# Patient Record
Sex: Female | Born: 1980 | Race: Black or African American | Hispanic: No | Marital: Single | Smoking: Current every day smoker
Health system: Southern US, Community
[De-identification: ages and names within clinical notes are randomized; demographics above are authoritative.]

## PROBLEM LIST (undated history)

## (undated) DIAGNOSIS — I219 Acute myocardial infarction, unspecified: Secondary | ICD-10-CM

## (undated) DIAGNOSIS — E785 Hyperlipidemia, unspecified: Secondary | ICD-10-CM

## (undated) DIAGNOSIS — F329 Major depressive disorder, single episode, unspecified: Secondary | ICD-10-CM

## (undated) DIAGNOSIS — K219 Gastro-esophageal reflux disease without esophagitis: Secondary | ICD-10-CM

## (undated) DIAGNOSIS — F32A Depression, unspecified: Secondary | ICD-10-CM

## (undated) HISTORY — DX: Gastro-esophageal reflux disease without esophagitis: K21.9

## (undated) HISTORY — PX: CHOLECYSTECTOMY: SHX55

## (undated) HISTORY — PX: TONSILLECTOMY AND ADENOIDECTOMY: SUR1326

## (undated) HISTORY — DX: Major depressive disorder, single episode, unspecified: F32.9

## (undated) HISTORY — DX: Acute myocardial infarction, unspecified: I21.9

## (undated) HISTORY — DX: Hyperlipidemia, unspecified: E78.5

## (undated) HISTORY — DX: Depression, unspecified: F32.A

---

## 2011-12-28 ENCOUNTER — Encounter (HOSPITAL_BASED_OUTPATIENT_CLINIC_OR_DEPARTMENT_OTHER): Payer: Self-pay | Admitting: Emergency Medicine

## 2011-12-28 ENCOUNTER — Emergency Department (HOSPITAL_BASED_OUTPATIENT_CLINIC_OR_DEPARTMENT_OTHER)
Admission: EM | Admit: 2011-12-28 | Discharge: 2011-12-28 | Disposition: A | Discharge status: Home / Self Care | Payer: Self-pay | Attending: Emergency Medicine | Admitting: Emergency Medicine

## 2011-12-28 DIAGNOSIS — F17200 Nicotine dependence, unspecified, uncomplicated: Secondary | ICD-10-CM | POA: Insufficient documentation

## 2011-12-28 DIAGNOSIS — L408 Other psoriasis: Secondary | ICD-10-CM | POA: Insufficient documentation

## 2011-12-28 DIAGNOSIS — L42 Pityriasis rosea: Secondary | ICD-10-CM

## 2011-12-28 LAB — RPR: RPR Ser Ql: NONREACTIVE

## 2011-12-28 LAB — PREGNANCY, URINE: Preg Test, Ur: NEGATIVE

## 2011-12-28 MED ORDER — HYDROXYZINE HCL 25 MG PO TABS: 25.0000 mg | ORAL_TABLET | Freq: Four times a day (QID) | ORAL | Status: AC | PRN

## 2011-12-28 MED ORDER — HYDROXYZINE HCL 25 MG PO TABS
25.0000 mg | ORAL_TABLET | Freq: Once | ORAL | Status: AC
  Administered 2011-12-28: 25 mg via ORAL
  Filled 2011-12-28: qty 1

## 2011-12-28 NOTE — ED Notes (Signed)
Pt reports bumps on fingers and hands x 2 weeks, now under breast

## 2011-12-28 NOTE — ED Provider Notes (Signed)
History     CSN: [id number]  Arrival date & time 12/28/11  0121   First MD Initiated Contact with Patient 12/28/11 [phone]      Chief Complaint  Patient presents with  . Rash    (Consider location/radiation/quality/duration/timing/severity/associated sxs/prior treatment) HPI This is a 31 year old black female with a several week history of rash. She is somewhat equivocal about when the first symptoms appeared. She states she initially had a hyperpigmented plaque develop on her left flank in February and another plaque on her right jaw about 3 weeks ago. She had been treating these with calamine lotion. Subsequent to the appearance of the plaque on her right jaw she has developed a more generalized occurrence of a smaller, less severe plaques primarily on her chest. She is also complaining of small "heat bumps" just proximal to her fingernails. She states all of these lesions are severely pruritic. She denies systemic symptoms such as fever, chills or cold symptoms.  History reviewed. No pertinent past medical history.  History reviewed. No pertinent past surgical history.  History reviewed. No pertinent family history.  History  Substance Use Topics  . Smoking status: Current Everyday Smoker -- 1.0 packs/day  . Smokeless tobacco: Not on file  . Alcohol Use: Yes     ocassionally    OB History    Grav Para Term Preterm Abortions TAB SAB Ect Mult Living                  Review of Systems  All other systems reviewed and are negative.    Allergies  Review of patient's allergies indicates no known allergies.  Home Medications  No current outpatient prescriptions on file.  BP 126/80  Pulse 88  Resp 20  SpO2 100%  LMP 08/03/2011  Physical Exam General: Well-developed, well-nourished female in no acute distress; appearance consistent with age of record HENT: normocephalic, atraumatic Eyes: pupils equal round and reactive to light; extraocular muscles intact Neck:  supple Heart: regular rate and rhythm Lungs: Normal respiratory effort and excursion Abdomen: soft; nondistended Extremities: No deformity; full range of motion Neurologic: Awake, alert and oriented; motor function intact in all extremities and symmetric; no facial droop Skin: Warm and dry; fading hyperpigmented irregular plaque of the left flank, hyperpigmented irregular plaque of the right mandible and scattered smaller irregular plaques primarily on the trunk; fine miliary rash proximal to fingernails Psychiatric: Normal mood and affect    ED Course  Procedures (including critical care time)     MDM  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        Hanley Seamen, MD 12/28/11 [phone]

## 2011-12-28 NOTE — Discharge Instructions (Signed)
Pityriasis Rosea Pityriasis rosea is a rash which is probably caused by a virus. It generally starts as a scaly, red patch on the trunk (the area of the body that a t-shirt would cover) but does not appear on sun exposed areas. The rash is usually preceded by an initial larger spot called the "herald patch" a week or more before the rest of the rash appears. Generally within one to two days the rash appears rapidly on the trunk, upper arms, and sometimes the upper legs. The rash usually appears as flat, oval patches of scaly pink color. The rash can also be raised and one is able to feel it with a finger. The rash can also be finely crinkled and may slough off leaving a ring of scale around the spot. Sometimes a mild sore throat is present with the rash. It usually affects children and young adults in the spring and autumn. Women are more frequently affected than men. TREATMENT  Pityriasis rosea is a self-limited condition. This means it goes away within 4 to 8 weeks without treatment. The spots may persist for several months, especially in darker-colored skin after the rash has resolved and healed. Benadryl and steroid creams may be used if itching is a problem. SEEK MEDICAL CARE IF:   Your rash does not go away or persists longer than three months.   You develop fever and joint pain.   You develop severe headache and confusion.   You develop breathing difficulty, vomiting and/or extreme weakness.  Document Released: 07/25/2001 Document Revised: 06/07/2011 Document Reviewed: 08/13/2008 [employer] Patient Information 2012 [employer]. 

## 2013-03-25 ENCOUNTER — Emergency Department (HOSPITAL_BASED_OUTPATIENT_CLINIC_OR_DEPARTMENT_OTHER)
Admission: EM | Admit: 2013-03-25 | Discharge: 2013-03-25 | Disposition: A | Discharge status: Home / Self Care | Payer: Self-pay | Attending: Emergency Medicine | Admitting: Emergency Medicine

## 2013-03-25 ENCOUNTER — Encounter (HOSPITAL_BASED_OUTPATIENT_CLINIC_OR_DEPARTMENT_OTHER): Payer: Self-pay | Admitting: *Deleted

## 2013-03-25 DIAGNOSIS — R51 Headache: Secondary | ICD-10-CM | POA: Insufficient documentation

## 2013-03-25 DIAGNOSIS — K0889 Other specified disorders of teeth and supporting structures: Secondary | ICD-10-CM

## 2013-03-25 DIAGNOSIS — K089 Disorder of teeth and supporting structures, unspecified: Secondary | ICD-10-CM | POA: Insufficient documentation

## 2013-03-25 DIAGNOSIS — K047 Periapical abscess without sinus: Secondary | ICD-10-CM | POA: Insufficient documentation

## 2013-03-25 DIAGNOSIS — M542 Cervicalgia: Secondary | ICD-10-CM | POA: Insufficient documentation

## 2013-03-25 DIAGNOSIS — F17200 Nicotine dependence, unspecified, uncomplicated: Secondary | ICD-10-CM | POA: Insufficient documentation

## 2013-03-25 DIAGNOSIS — K029 Dental caries, unspecified: Secondary | ICD-10-CM | POA: Insufficient documentation

## 2013-03-25 MED ORDER — BUPIVACAINE-EPINEPHRINE (PF) 0.5% -1:200000 IJ SOLN
INTRAMUSCULAR | Status: AC
  Administered 2013-03-25: 9 mg
  Filled 2013-03-25: qty 1.8

## 2013-03-25 MED ORDER — IBUPROFEN 800 MG PO TABS
800.0000 mg | ORAL_TABLET | Freq: Once | ORAL | Status: AC
  Administered 2013-03-25: 800 mg via ORAL
  Filled 2013-03-25: qty 1

## 2013-03-25 MED ORDER — BUPIVACAINE-EPINEPHRINE (PF) 0.5% -1:200000 IJ SOLN
INTRAMUSCULAR | Status: AC
  Administered 2013-03-25: 08:00:00
  Filled 2013-03-25: qty 1.8

## 2013-03-25 MED ORDER — IBUPROFEN 800 MG PO TABS: 800.0000 mg | ORAL_TABLET | Freq: Three times a day (TID) | ORAL | Status: DC

## 2013-03-25 MED ORDER — PENICILLIN V POTASSIUM 500 MG PO TABS: 500.0000 mg | ORAL_TABLET | Freq: Four times a day (QID) | ORAL | Status: AC

## 2013-03-25 MED ORDER — PENICILLIN V POTASSIUM 250 MG PO TABS
500.0000 mg | ORAL_TABLET | Freq: Once | ORAL | Status: AC
  Administered 2013-03-25: 500 mg via ORAL
  Filled 2013-03-25: qty 2

## 2013-03-25 NOTE — ED Notes (Signed)
Care assumed and pt assessment performed prior to report.

## 2013-03-25 NOTE — ED Provider Notes (Signed)
CSN: [id number]     Arrival date & time 03/25/13  0700 History   First MD Initiated Contact with Patient 03/25/13 [phone]     Chief Complaint  Patient presents with  . Dental Pain   (Consider location/radiation/quality/duration/timing/severity/associated sxs/prior Treatment) Patient is a 32 y.o. female presenting with tooth pain. The history is provided by the patient.  Dental Pain Location:  Lower and upper Upper teeth location:  1/RU 3rd molar and 2/RU 2nd molar Lower teeth location:  31/RL 2nd molar and 32/RL 3rd molar Quality:  Dull and aching Severity:  Moderate Onset quality:  Gradual Duration:  3 days Timing:  Constant Progression:  Worsening Chronicity:  New Context: crown fracture, dental caries and dental fracture   Context: not abscess, filling still in place, not intrusion, not recent dental surgery and not trauma   Previous work-up:  Filled cavity Relieved by:  Nothing Worsened by:  Nothing tried Ineffective treatments:  Acetaminophen Associated symptoms: drooling, facial pain (R face) and neck pain   Associated symptoms: no congestion, no difficulty swallowing, no facial swelling, no fever, no gum swelling, no headaches, no neck swelling, no oral bleeding, no oral lesions and no trismus     History reviewed. No pertinent past medical history. History reviewed. No pertinent past surgical history. History reviewed. No pertinent family history. History  Substance Use Topics  . Smoking status: Current Every Day Smoker -- 1.00 packs/day  . Smokeless tobacco: Not on file  . Alcohol Use: Yes     Comment: ocassionally   OB History   Grav Para Term Preterm Abortions TAB SAB Ect Mult Living                 Review of Systems  Constitutional: Negative for fever.  HENT: Positive for drooling and neck pain. Negative for congestion, facial swelling and mouth sores.   Neurological: Negative for headaches.  All other systems reviewed and are negative.    Allergies   Review of patient's allergies indicates no known allergies.  Home Medications  No current outpatient prescriptions on file. BP 130/64  Pulse 94  Temp(Src) 98.3 F (36.8 C) (Oral)  Resp 16  SpO2 99%  LMP 03/11/2013 Physical Exam  Nursing note and vitals reviewed. Constitutional: She is oriented to person, place, and time. She appears well-developed and well-nourished. No distress.  HENT:  Head: Normocephalic and atraumatic.  Right Ear: Hearing, tympanic membrane, external ear and ear canal normal.  Left Ear: Hearing, tympanic membrane, external ear and ear canal normal.  Eyes: EOM are normal. Pupils are equal, round, and reactive to light.  Neck: Normal range of motion. Neck supple. No JVD present. Tracheal tenderness (R side) present. No spinous process tenderness and no muscular tenderness present. No edema, no erythema and normal range of motion present.  No neck lymphadenopathy. Submental space soft  Cardiovascular: Normal rate and regular rhythm.  Exam reveals no friction rub.   No murmur heard. Pulmonary/Chest: Effort normal and breath sounds normal. No stridor. No respiratory distress. She has no wheezes. She has no rales.  Abdominal: Soft. She exhibits no distension. There is no tenderness. There is no rebound.  Musculoskeletal: Normal range of motion. She exhibits no edema.  Neurological: She is alert and oriented to person, place, and time.  Skin: She is not diaphoretic.    ED Course  NERVE BLOCK Date/Time: 03/25/2013 7:47 AM Performed by: Dagmar Hait Authorized by: Dagmar Hait Consent: Verbal consent obtained. Indications: pain relief Body area: face/mouth Nerve:  inferior alveolar Laterality: right Patient sedated: no Preparation: Patient was prepped and draped in the usual sterile fashion. Patient position: supine Needle gauge: 22 G Location technique: anatomical landmarks Local anesthetic: bupivacaine 0.5% with epinephrine Anesthetic  total: 2 ml Outcome: pain improved Patient tolerance: Patient tolerated the procedure well with no immediate complications.  NERVE BLOCK Date/Time: 03/25/2013 7:48 AM Performed by: Dagmar Hait Authorized by: Dagmar Hait Consent: Verbal consent obtained. Indications: pain relief Body area: face/mouth Nerve: posterior superior alveolar Laterality: right Patient sedated: no Preparation: Patient was prepped and draped in the usual sterile fashion. Patient position: supine Needle gauge: 22 G Location technique: anatomical landmarks Local anesthetic: bupivacaine 0.5% with epinephrine Anesthetic total: 2 ml Outcome: pain improved Patient tolerance: Patient tolerated the procedure well with no immediate complications.   (including critical care time) Labs Review Labs Reviewed - No data to display Imaging Review No results found.  MDM   1. Tooth pain   2. Periapical abscess    59F presents with dental pain. Hx of dental issues, presents with 3 days of R posterior maxilla and mandible pain. Radiates to R ear and R neck. No fevers, no SOB, no difficulty breathing, no rhinorrhea/congestion. No drainage in the mouth. Here AFVSS. Poor dentition throughout the mouth, broken teeth and tooth fragments in all molars. R posterior maxillary and mandibular teeth broken with tooth fragments. No intraoral abscess. R neck tender to palpation, no lymphadenopathy. No sublingual swelling, submental space soft. No airway swelling, no stridor. Neck supple with normal ROM.  Intraoral blocks provided, penicillin and motrin given. Given f/u dental resources and Rx for penicillin and motrin.     Dagmar Hait, MD 03/25/13 [phone]

## 2013-03-25 NOTE — ED Notes (Signed)
3 days of pain in right side of mouth upper and lower thinks may be her wisdom teeth

## 2014-07-08 ENCOUNTER — Encounter (HOSPITAL_BASED_OUTPATIENT_CLINIC_OR_DEPARTMENT_OTHER): Payer: Self-pay | Admitting: *Deleted

## 2014-07-08 ENCOUNTER — Emergency Department (HOSPITAL_BASED_OUTPATIENT_CLINIC_OR_DEPARTMENT_OTHER)
Admission: EM | Admit: 2014-07-08 | Discharge: 2014-07-08 | Disposition: A | Discharge status: Home / Self Care | Payer: Self-pay | Attending: Emergency Medicine | Admitting: Emergency Medicine

## 2014-07-08 DIAGNOSIS — N390 Urinary tract infection, site not specified: Secondary | ICD-10-CM | POA: Insufficient documentation

## 2014-07-08 DIAGNOSIS — Z792 Long term (current) use of antibiotics: Secondary | ICD-10-CM | POA: Insufficient documentation

## 2014-07-08 DIAGNOSIS — Z3202 Encounter for pregnancy test, result negative: Secondary | ICD-10-CM | POA: Insufficient documentation

## 2014-07-08 DIAGNOSIS — Z720 Tobacco use: Secondary | ICD-10-CM | POA: Insufficient documentation

## 2014-07-08 DIAGNOSIS — N72 Inflammatory disease of cervix uteri: Secondary | ICD-10-CM | POA: Insufficient documentation

## 2014-07-08 DIAGNOSIS — R102 Pelvic and perineal pain: Secondary | ICD-10-CM | POA: Insufficient documentation

## 2014-07-08 DIAGNOSIS — Z791 Long term (current) use of non-steroidal anti-inflammatories (NSAID): Secondary | ICD-10-CM | POA: Insufficient documentation

## 2014-07-08 LAB — WET PREP, GENITAL
Trich, Wet Prep: NONE SEEN
YEAST WET PREP: NONE SEEN

## 2014-07-08 LAB — URINE MICROSCOPIC-ADD ON

## 2014-07-08 LAB — URINALYSIS, ROUTINE W REFLEX MICROSCOPIC
Bilirubin Urine: NEGATIVE
GLUCOSE, UA: NEGATIVE mg/dL
KETONES UR: 15 mg/dL — AB
NITRITE: NEGATIVE
PH: 5.5 (ref 5.0–8.0)
Protein, ur: 30 mg/dL — AB
SPECIFIC GRAVITY, URINE: 1.025 (ref 1.005–1.030)
Urobilinogen, UA: 0.2 mg/dL (ref 0.0–1.0)

## 2014-07-08 LAB — PREGNANCY, URINE: PREG TEST UR: NEGATIVE

## 2014-07-08 MED ORDER — CIPROFLOXACIN HCL 500 MG PO TABS: 500.0000 mg | ORAL_TABLET | Freq: Two times a day (BID) | ORAL | Status: DC

## 2014-07-08 MED ORDER — LIDOCAINE HCL (PF) 1 % IJ SOLN
INTRAMUSCULAR | Status: AC
  Administered 2014-07-08: 5 mL
  Filled 2014-07-08: qty 5

## 2014-07-08 MED ORDER — CEFTRIAXONE SODIUM 250 MG IJ SOLR
250.0000 mg | Freq: Once | INTRAMUSCULAR | Status: AC
  Administered 2014-07-08: 250 mg via INTRAMUSCULAR
  Filled 2014-07-08: qty 250

## 2014-07-08 MED ORDER — AZITHROMYCIN 1 G PO PACK
1.0000 g | PACK | Freq: Once | ORAL | Status: AC
  Administered 2014-07-08: 1 g via ORAL
  Filled 2014-07-08: qty 1

## 2014-07-08 MED ORDER — IBUPROFEN 800 MG PO TABS: 800.0000 mg | ORAL_TABLET | Freq: Three times a day (TID) | ORAL | Status: DC

## 2014-07-08 NOTE — ED Notes (Signed)
Pt states that she does not use birth control and could be pregnant. amb to bathroom for urine sample.

## 2014-07-08 NOTE — ED Notes (Signed)
Pt amb to room 5 with quick steady gait in nad. Pt reports starting her menses yesterday, and has been having pelvic cramping since then. Pt wonders if it is related to her use of tampons. Pt states pain is greater on left pelvis than right.

## 2014-07-08 NOTE — Discharge Instructions (Signed)
Urinary Tract Infection Urinary tract infections (UTIs) can develop anywhere along your urinary tract. Your urinary tract is your body's drainage system for removing wastes and extra water. Your urinary tract includes two kidneys, two ureters, a bladder, and a urethra. Your kidneys are a pair of bean-shaped organs. Each kidney is about the size of your fist. They are located below your ribs, one on each side of your spine. CAUSES Infections are caused by microbes, which are microscopic organisms, including fungi, viruses, and bacteria. These organisms are so small that they can only be seen through a microscope. Bacteria are the microbes that most commonly cause UTIs. SYMPTOMS  Symptoms of UTIs may vary by age and gender of the patient and by the location of the infection. Symptoms in young women typically include a frequent and intense urge to urinate and a painful, burning feeling in the bladder or urethra during urination. Older women and men are more likely to be tired, shaky, and weak and have muscle aches and abdominal pain. A fever may mean the infection is in your kidneys. Other symptoms of a kidney infection include pain in your back or sides below the ribs, nausea, and vomiting. DIAGNOSIS To diagnose a UTI, your caregiver will ask you about your symptoms. Your caregiver also will ask to provide a urine sample. The urine sample will be tested for bacteria and Going blood cells. Schmelzle blood cells are made by your body to help fight infection. TREATMENT  Typically, UTIs can be treated with medication. Because most UTIs are caused by a bacterial infection, they usually can be treated with the use of antibiotics. The choice of antibiotic and length of treatment depend on your symptoms and the type of bacteria causing your infection. HOME CARE INSTRUCTIONS  If you were prescribed antibiotics, take them exactly as your caregiver instructs you. Finish the medication even if you feel better after you  have only taken some of the medication.  Drink enough water and fluids to keep your urine clear or pale yellow.  Avoid caffeine, tea, and carbonated beverages. They tend to irritate your bladder.  Empty your bladder often. Avoid holding urine for long periods of time.  Empty your bladder before and after sexual intercourse.  After a bowel movement, women should cleanse from front to back. Use each tissue only once. SEEK MEDICAL CARE IF:   You have back pain.  You develop a fever.  Your symptoms do not begin to resolve within 3 days. SEEK IMMEDIATE MEDICAL CARE IF:   You have severe back pain or lower abdominal pain.  You develop chills.  You have nausea or vomiting.  You have continued burning or discomfort with urination. MAKE SURE YOU:   Understand these instructions.  Will watch your condition.  Will get help right away if you are not doing well or get worse. Document Released: 03/28/2005 Document Revised: 12/18/2011 Document Reviewed: 07/27/2011 [facility] Patient Information 2015 [place], [place]. This information is not intended to replace advice given to you by your health care provider. Make sure you discuss any questions you have with your health care provider. Pelvic Pain Female pelvic pain can be caused by many different things and start from a variety of places. Pelvic pain refers to pain that is located in the lower half of the abdomen and between your hips. The pain may occur over a short period of time (acute) or may be reoccurring (chronic). The cause of pelvic pain may be related to disorders affecting the female  reproductive organs (gynecologic), but it may also be related to the bladder, kidney stones, an intestinal complication, or muscle or skeletal problems. Getting help right away for pelvic pain is important, especially if there has been severe, sharp, or a sudden onset of unusual pain. It is also important to get help right away because some types of pelvic  pain can be life threatening.  CAUSES  Below are only some of the causes of pelvic pain. The causes of pelvic pain can be in one of several categories.   Gynecologic.  Pelvic inflammatory disease.  Sexually transmitted infection.  Ovarian cyst or a twisted ovarian ligament (ovarian torsion).  Uterine lining that grows outside the uterus (endometriosis).  Fibroids, cysts, or tumors.  Ovulation.  Pregnancy.  Pregnancy that occurs outside the uterus (ectopic pregnancy).  Miscarriage.  Labor.  Abruption of the placenta or ruptured uterus.  Infection.  Uterine infection (endometritis).  Bladder infection.  Diverticulitis.  Miscarriage related to a uterine infection (septic abortion).  Bladder.  Inflammation of the bladder (cystitis).  Kidney stone(s).  Gastrointestinal.  Constipation.  Diverticulitis.  Neurologic.  Trauma.  Feeling pelvic pain because of mental or emotional causes (psychosomatic).  Cancers of the bowel or pelvis. EVALUATION  Your caregiver will want to take a careful history of your concerns. This includes recent changes in your health, a careful gynecologic history of your periods (menses), and a sexual history. Obtaining your family history and medical history is also important. Your caregiver may suggest a pelvic exam. A pelvic exam will help identify the location and severity of the pain. It also helps in the evaluation of which organ system may be involved. In order to identify the cause of the pelvic pain and be properly treated, your caregiver may order tests. These tests may include:   A pregnancy test.  Pelvic ultrasonography.  An X-ray exam of the abdomen.  A urinalysis or evaluation of vaginal discharge.  Blood tests. HOME CARE INSTRUCTIONS   Only take over-the-counter or prescription medicines for pain, discomfort, or fever as directed by your caregiver.   Rest as directed by your caregiver.   Eat a balanced diet.    Drink enough fluids to make your urine clear or pale yellow, or as directed.   Avoid sexual intercourse if it causes pain.   Apply warm or cold compresses to the lower abdomen depending on which one helps the pain.   Avoid stressful situations.   Keep a journal of your pelvic pain. Write down when it started, where the pain is located, and if there are things that seem to be associated with the pain, such as food or your menstrual cycle.  Follow up with your caregiver as directed.  SEEK MEDICAL CARE IF:  Your medicine does not help your pain.  You have abnormal vaginal discharge. SEEK IMMEDIATE MEDICAL CARE IF:   You have heavy bleeding from the vagina.   Your pelvic pain increases.   You feel light-headed or faint.   You have chills.   You have pain with urination or blood in your urine.   You have uncontrolled diarrhea or vomiting.   You have a fever or persistent symptoms for more than 3 days.  You have a fever and your symptoms suddenly get worse.   You are being physically or sexually abused.  MAKE SURE YOU:  Understand these instructions.  Will watch your condition.  Will get help if you are not doing well or get worse. Document Released: 05/15/2004 Document  Revised: 11/02/2013 Document Reviewed: 10/08/2011 [facility] Patient Information 2015 [place], [place]. This information is not intended to replace advice given to you by your health care provider. Make sure you discuss any questions you have with your health care provider.  Emergency Department Resource Guide 1) Find a Doctor and Pay Out of Pocket Although you won't have to find out who is covered by your insurance plan, it is a good idea to ask around and get recommendations. You will then need to call the office and see if the doctor you have chosen will accept you as a new patient and what types of options they offer for patients who are self-pay. Some doctors offer discounts or will set up  payment plans for their patients who do not have insurance, but you will need to ask so you aren't surprised when you get to your appointment.  2) Contact Your Local Health Department Not all health departments have doctors that can see patients for sick visits, but many do, so it is worth a call to see if yours does. If you don't know where your local health department is, you can check in your phone book. The CDC also has a tool to help you locate your state's health department, and many state websites also have listings of all of their local health departments.  3) Find a Walk-in Clinic If your illness is not likely to be very severe or complicated, you may want to try a walk in clinic. These are popping up all over the country in pharmacies, drugstores, and shopping centers. They're usually staffed by nurse practitioners or physician assistants that have been trained to treat common illnesses and complaints. They're usually fairly quick and inexpensive. However, if you have serious medical issues or chronic medical problems, these are probably not your best option.  No Primary Care Doctor: - Call Health Connect at  [phone] - they can help you locate a primary care doctor that  accepts your insurance, provides certain services, etc. - Physician Referral Service- [phone]  Chronic Pain Problems: Organization         Address  Phone   Notes  [facility]  [phone] Patients need to be referred by their primary care doctor.   Medication Assistance: Organization         Address  Phone   Notes  [facility] [address] [place], [place] [address] [phone] --Must be a resident of [facility] -- Must have NO insurance coverage whatsoever (no Medicaid/ Medicare, etc.) -- The pt. MUST have a primary care doctor that directs their care regularly and follows them in the community   [facility]  [phone]   [employer]  [phone]    Agencies that provide inexpensive medical care: Organization         Address  Phone   Notes  [facility]  [phone]   [facility]    [phone]   [facility] [address] [place], [place] [address] [phone]   [facility] [address] [place] [place] [phone]   Planned Parenthood    [phone]   [facility]    [phone]   Community Health and [facility]  [address], [place] Phone:  [phone], Fax:  [phone] Hours of Operation:  9 am - 6 pm, M-F.  Also accepts Medicaid/Medicare and  self-pay.  [facility]  [address], [place] Phone: [phone]) [phone], Fax: [phone]. Hours of Operation:  8:30 am - 5:30 pm, M-F.  Also accepts Medicaid and self-pay.  [facility] [place] [address], [place] Phone: [phone]) [phone]   [facility] [address], [place], [place] [phone], Ext. [phone] Mondays & Thursdays: 7-9 AM.  First 15 patients are seen on a first come, first serve basis.    Medicaid-accepting [facility] Providers:  Organization         Address  Phone   Notes  [facility] [address], [place] [phone]) [phone] Also accepts self-pay patients.  [facility] [address], [place]  [phone]) [phone]   [facility] [address], [place] [phone]) [phone]   [facility] [address] [place] [address], [place] [phone]) [phone]   Renaye RakersVeita Bland [address], [place]   [phone]) [phone] Only accepts [place] Access [place] patients after they have their name applied to their card.   Self-Pay (no insurance) in [facility]:  Organization         Address  Phone   Notes  Sickle Cell Patients, [facility] [address], [place]  [phone]   [facility] [address], [place] [phone](336) [phone]   [facility]  [address],  [phone]) [phone]   [facility]/Dr. Osei-Bonsu  [address] [place] [address], [place] or [phone], [place] [phone] Phone number for both [place] and [place] locations is the same.  [facility] [address], [place] [phone]) [phone]   [facility] [address] [place] [address], [place] or [address] [phone](336) [phone]   [facility] [address], [place] [phone]) [phone], phone; [phone]) [phone], fax Sees patients 1st and 3rd Saturday of every month.  Must not qualify for public or private insurance (i.e. Medicaid, Medicare, [place] Health Choice, Veterans' Benefits)  Household income should be no more than 200% of the poverty level The clinic cannot treat you if you are pregnant or think you are pregnant  Sexually transmitted diseases are not treated at the clinic.    Dental Care: Organization         Address  Phone  Notes  [facility] Department of [facility] [address], [place] [phone]) [phone] Accepts children up to age 32 who are enrolled in [place] or [place] Health Choice; pregnant women with a Medicaid card; and children who have applied for Medicaid or [place] Health Choice, but were declined, whose parents can pay a reduced fee at time of service.  [facility] Department of [facility] [place]  [address], [place] [phone]) [phone] Accepts children up to age 32 who are enrolled in [place] or [place] Health Choice; pregnant women with a Medicaid card; and children who have applied for Medicaid or  Health Choice, but were declined, whose parents can pay a reduced fee at time of service.  [facility]  [address], [place] [phone](336) [phone] Patients  are seen by appointment only. Walk-ins are not accepted. [facility] will see patients 32 years of age and older. Monday - Tuesday (8am-5pm) Most Wednesdays (8:30-5pm) $  30 per visit, cash only  [facility] [employer] PRO[facility]  [address], [facility] [phone] Patients are seen by appointment only. Walk-ins are not accepted. [facility] will see patients 66 years of age and older. One Wednesday Evening (Monthly: Volunteer Based).  $30 per visit, cash only  [employer]  [phone] for adults; Children under age 35, call Graduate Pediatric Dentistry at [phone]. Children aged 40-14, please call [phone] to request a pediatric application.  Dental services are provided in all areas of dental care including fillings, crowns and bridges, complete and partial dentures, implants, gum treatment, root canals, and extractions. Preventive care is also provided. Treatment is provided to both adults and children. Patients are selected via a lottery and there is often a waiting list.   [facility] [address] [place]  [phone] [email or web address]   [facility] [address] [place], [place] [phone], Ext. [phone] Second and Fourth Thursday of each month, opens at 6:30 AM; Clinic ends at 9 AM.  Patients are seen on a first-come first-served basis, and a limited number are seen during each clinic.   [facility]  [address] [place], [place] [phone]   Eligibility Requirements You must have lived in [place], [place], or [place] counties for at least the last three months.   You cannot be eligible for state or federal sponsored [place], including [employer], [place], or [employer].   You generally cannot be eligible for healthcare insurance through your employer.    How to apply: Eligibility screenings are held every Tuesday and Wednesday afternoon from 1:00 pm until  4:00 pm. You do not need an appointment for the interview!  [facility] [address], [place], [place] [phone]   [facility] Health Department  [phone]   [facility] Health Department  [phone]   [facility] Health Department  [phone]    Behavioral Health Resources in the Community: Intensive Outpatient Programs Organization         Address  Phone  Notes  [facility] [address], [place], [place] [phone]   [facility] [address], [place], [place] [phone]   [facility] [address] [place], [place]  [phone]   [facility] [address],  [place], [place] [phone] or [phone]   Substance Abuse Resources Organization         Address  Phone  Notes  [facility]  3024883831   [facility]  [phone]   [facility] [place]  [phone]   Floydene Flock  [phone]   Residential & Outpatient Substance Abuse Program  [phone]   Psychological Services Organization         Address  Phone  Notes  [facility] Behavioral Health  [phone]   [place]  [phone]   [facility] [address] [place] [phone] or [phone]    Mobile Crisis Teams Organization         Address  Phone  Notes  [facility], Mobile Crisis Care Unit  [phone]   [facility]  [address] [place], [place] [phone]   Doristine Locks [address] [place] [phone]    Self-Help/Support Groups Organization         Address  Phone  Notes  [facility] [place] - variety of support groups  [phone]- I7437963 Call for more information  Narcotics Anonymous (NA), [facility] [address], [employer] [place]  2 meetings at this location   Statistician          Address  Phone  Notes  [facility] [address],    [place]  [phone]   [facility]  [address] [place] [address], [place], [place] [phone]   [facility] [address] [place], [place] [phone] Admissions: 8am-3pm M-F  [facility] [address]    [place], [place] [phone]   [facility] [address] [place], [place], [place] [phone]   [facility] [address],  [place], [place] [phone]   [facility] - Intensive Outpatient [address], [place], [place] [phone]   [facility] [address]  [place], [place] [phone] or [phone]   [facility] ([facility]) [address], [place], [place] [phone] Accepts Medicaid  [facility] [address],  [place] [phone] Substance Abuse/Addiction Treatment   [facility] Organization         Address  Phone  Notes  [facility]  [phone]   Angie Fava, PhD [address] [place], [place]   [phone] or [phone]   [facility]   [address] [place], [place] [phone]   [facility] [address] [place], [place] [phone] Insurance/Medicaid/sponsorship through [facility] [address]                                    [place], [place] [phone] Therapy/tele-psych/case  [facility] [address], [place] [phone]    Dr. Lolly Mustache  [phone]   [facility]  [employer] [facility] Dept. 1) [address] [place] 2) [address], [place] 3)  [address], [place] [phone]  [phone]   [facility] Child Abuse Hotline ([phone])[phone] or [phone] (After Hours)

## 2014-07-08 NOTE — ED Provider Notes (Signed)
CSN: [id number]     Arrival date & time 07/08/14  0737 History   First MD Initiated Contact with Patient 07/08/14 0750     Chief Complaint  Patient presents with  . Pelvic Pain     (Consider location/radiation/quality/duration/timing/severity/associated sxs/prior Treatment) HPI The patient states she started her menstrual cycle 2 days ago. She reports at the onset of menstrual cycle she often has a lot of pelvic pain. The first day she thought it was typical pain. She tried some Tylenol and got some relief. She reports this morning however the pain was much worse. She reports is generalized over her lower pelvis and slightly more to the left. She reports normal quality of menstrual bleeding. He denies abnormal vaginal discharge. She denies other associated symptoms of pain burning or urgency with urination. Dissection is sexually active. She reports she has been with the same partner for 10 years. She reports that she does not have concern for sexually transmitted disease. She does however report she was diagnosed gonorrhea approximately 5 years ago. She does not use birth control and has never gotten pregnant. She reports she has tried in the past but has not conceived. History reviewed. No pertinent past medical history. History reviewed. No pertinent past surgical history. History reviewed. No pertinent family history. History  Substance Use Topics  . Smoking status: Current Every Day Smoker -- 1.00 packs/day  . Smokeless tobacco: Not on file  . Alcohol Use: Yes     Comment: ocassionally   OB History    No data available     Review of Systems 10 Systems reviewed and are negative for acute change except as noted in the HPI.    Allergies  Review of patient's allergies indicates no known allergies.  Home Medications   Prior to Admission medications   Medication Sig Start Date End Date Taking? Authorizing Provider  ciprofloxacin (CIPRO) 500 MG tablet Take 1 tablet (500 mg total)  by mouth 2 (two) times daily. 07/08/14   Arby Barrette, MD  ibuprofen (ADVIL,MOTRIN) 800 MG tablet Take 1 tablet (800 mg total) by mouth 3 (three) times daily. 03/25/13   Elwin Mocha, MD  ibuprofen (ADVIL,MOTRIN) 800 MG tablet Take 1 tablet (800 mg total) by mouth 3 (three) times daily. 07/08/14   Arby Barrette, MD   BP 123/77 mmHg  Pulse 72  Temp(Src) 98.5 F (36.9 C) (Oral)  Resp 18  Ht  (1.702 m)  Wt 270 lb (122.471 kg)  BMI 42.28 kg/m2  SpO2 100%  LMP 07/07/2014 Physical Exam  Constitutional: She is oriented to person, place, and time. She appears well-developed and well-nourished.  The patient is morbidly obese alert and nontoxic.  HENT:  Head: Normocephalic and atraumatic.  Eyes: EOM are normal.  Cardiovascular: Normal rate, regular rhythm, normal heart sounds and intact distal pulses.   Pulmonary/Chest: Effort normal and breath sounds normal.  Abdominal: Soft. Bowel sounds are normal. She exhibits no distension. There is tenderness (patient is moderately tender diffusely in the lower abdomen. There is no guarding or rebound. Pain is slightly more pronounced to the left lower quadrant.).  Genitourinary:  Normal appearance of external female genitalia. Speculum examination. Small to moderate amount of vaginal blood present. No clots or pooling. Cervix is normal in appearance. Patient doesn't endorse cervical motion tenderness in general tenderness to palpation of the adnexa A and uterus. No appreciable mass.  Musculoskeletal: Normal range of motion. She exhibits no edema.  Neurological: She is alert and oriented to person,  place, and time. She has normal strength. Coordination normal. GCS eye subscore is 4. GCS verbal subscore is 5. GCS motor subscore is 6.  Skin: Skin is warm, dry and intact.  Psychiatric: She has a normal mood and affect.    ED Course  Procedures (including critical care time) Labs Review Labs Reviewed  WET PREP, GENITAL - Abnormal; Notable for the  following:    Clue Cells Wet Prep HPF POC MODERATE (*)    WBC, Wet Prep HPF POC MODERATE (*)    All other components within normal limits  URINALYSIS, ROUTINE W REFLEX MICROSCOPIC - Abnormal; Notable for the following:    Color, Urine RED (*)    APPearance TURBID (*)    Hgb urine dipstick LARGE (*)    Ketones, ur 15 (*)    Protein, ur 30 (*)    Leukocytes, UA MODERATE (*)    All other components within normal limits  URINE MICROSCOPIC-ADD ON - Abnormal; Notable for the following:    Squamous Epithelial / LPF FEW (*)    Bacteria, UA MANY (*)    All other components within normal limits  GC/CHLAMYDIA PROBE AMP  URINE CULTURE  PREGNANCY, URINE    Imaging Review No results found.   EKG Interpretation None      MDM   Final diagnoses:  Pelvic pain in female  Cervicitis  UTI (lower urinary tract infection)   The patient has pelvic pain that she feels is atypically severe for her menstrual cycle. On examination she has positive cervical cervical motion tenderness. Although she denies concern for STD, by description she has had history of STDs within the confines of the current relationship. The patient will be treated empirically based on physical examination and pain out of proportion for her typical menstrual cycle. She will also be treated for UTI based on Pizzuto cells and leuk esterase present in urine specimen. Culture will be obtained.    Arby BarretteMarcy Damiya Sandefur, MD 07/08/14 [phone]

## 2014-07-09 LAB — URINE CULTURE: Colony Count: 25000

## 2014-07-13 LAB — GC/CHLAMYDIA PROBE AMP
CT Probe RNA: NEGATIVE
GC PROBE AMP APTIMA: NEGATIVE

## 2014-08-02 ENCOUNTER — Encounter: Payer: Self-pay | Admitting: Medical

## 2014-08-02 ENCOUNTER — Ambulatory Visit (INDEPENDENT_AMBULATORY_CARE_PROVIDER_SITE_OTHER): Payer: Self-pay | Admitting: Medical

## 2014-08-02 ENCOUNTER — Ambulatory Visit (HOSPITAL_BASED_OUTPATIENT_CLINIC_OR_DEPARTMENT_OTHER)
Admission: RE | Admit: 2014-08-02 | Discharge: 2014-08-02 | Disposition: A | Discharge status: Home / Self Care | Payer: Self-pay | Source: Ambulatory Visit | Attending: Medical | Admitting: Medical

## 2014-08-02 VITALS — BP 116/70 | HR 68 | Temp 97.6°F | Ht 67.0 in | Wt 237.8 lb

## 2014-08-02 DIAGNOSIS — M25519 Pain in unspecified shoulder: Secondary | ICD-10-CM | POA: Insufficient documentation

## 2014-08-02 DIAGNOSIS — Z720 Tobacco use: Secondary | ICD-10-CM

## 2014-08-02 DIAGNOSIS — F17200 Nicotine dependence, unspecified, uncomplicated: Secondary | ICD-10-CM | POA: Insufficient documentation

## 2014-08-02 DIAGNOSIS — M25511 Pain in right shoulder: Secondary | ICD-10-CM | POA: Insufficient documentation

## 2014-08-02 DIAGNOSIS — K219 Gastro-esophageal reflux disease without esophagitis: Secondary | ICD-10-CM

## 2014-08-02 DIAGNOSIS — F329 Major depressive disorder, single episode, unspecified: Secondary | ICD-10-CM

## 2014-08-02 DIAGNOSIS — F32A Depression, unspecified: Secondary | ICD-10-CM | POA: Insufficient documentation

## 2014-08-02 DIAGNOSIS — E785 Hyperlipidemia, unspecified: Secondary | ICD-10-CM | POA: Insufficient documentation

## 2014-08-02 MED ORDER — DICLOFENAC SODIUM 75 MG PO TBEC: 75.0000 mg | DELAYED_RELEASE_TABLET | Freq: Two times a day (BID) | ORAL | Status: DC

## 2014-08-02 NOTE — Progress Notes (Signed)
Pre visit review using our clinic review tool, if applicable. No additional management support is needed unless otherwise documented below in the visit note. 

## 2014-08-02 NOTE — Assessment & Plan Note (Signed)
For your shoulder pain. I rx'd diclofenac and put in order for rt shoulder xray. Do mild rom exercises

## 2014-08-02 NOTE — Assessment & Plan Note (Signed)
For your gerd. Continue the zantac.

## 2014-08-02 NOTE — Assessment & Plan Note (Signed)
For your depression, I need to get old records to have those on hand. You feel like you don't need depression medication presently. But getting old records would be helpful.

## 2014-08-02 NOTE — Progress Notes (Signed)
Subjective:    Patient ID: Tiffany Casey, female    DOB: [birth year], 34 y.o.   MRN: [id number]  HPI   I have reviewed pt PMH, PSH, FH, Social History and Surgical History  Depression- pt has that now. She states it was worse in the past than it is now. Medication in the past either did not work or gave side effects. Would make her drowsy or angry. Pt states years ago tried some years ago. She does not remember the name of the medications. She saw psychiatrist.  Pt does not know the exact address. She thinks name is [facility].  Gerd- Pt has some reflux. She has had that for about 10 years. Pt tries otc zantac. Gets reflux occasionally. 3-4 days a month.  Hyperlipidemia- Pt had this on screening but was at mild level per pt.  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 This was in 2007. She has not used any illegal drugs since then.  LMP- July 07, 2014.  [place] [place], No exercise, 2 years community college, 3 caffeinated beverages a day.    Also rt shoulder pain. No fall or injury. Just woke up and felt stiff. Since then shoulder hurts on rom.      Review of Systems  Constitutional: Negative for fever, chills and fatigue.  HENT: Negative for congestion, ear discharge, ear pain, nosebleeds, postnasal drip, rhinorrhea, sinus pressure, sore throat and trouble swallowing.   Respiratory: Negative for cough, chest tightness, shortness of breath and wheezing.   Cardiovascular: Negative for chest pain and palpitations.  Gastrointestinal: Negative for nausea, vomiting, abdominal pain, diarrhea and constipation.       Reflux but not now.  Genitourinary: Negative for dysuria and flank pain.  Musculoskeletal: Negative for back pain.       Rt shoulder pain.  Neurological: Negative for dizziness, tremors, seizures, syncope, weakness, light-headedness, numbness and headaches.  Hematological: Negative for adenopathy. Does not bruise/bleed easily.    Psychiatric/Behavioral: Positive for dysphoric mood. Negative for suicidal ideas and behavioral problems. The patient is not nervous/anxious.        Mild depression.   Past Medical History  Diagnosis Date  . Depression   . GERD (gastroesophageal reflux disease)   . Hyperlipidemia   . Myocardial infarction     History   Social History  . Marital Status: Single    Spouse Name: N/A    Number of Children: N/A  . Years of Education: N/A   Occupational History  . Not on file.   Social History Main Topics  . Smoking status: Current Every Day Smoker -- 0.25 packs/day for 14 years  . Smokeless tobacco: Not on file  . Alcohol Use: Yes     Comment: ocassionally. 3 times a month. 2-3 glasses wine, beer, or mixed drink.   . Drug Use: No  . Sexual Activity: Yes   Other Topics Concern  . Not on file   Social History Narrative    Past Surgical History  Procedure Laterality Date  . Cholecystectomy    . Tonsillectomy and adenoidectomy      Family History  Problem Relation Age of Onset  . Diabetes Mother   . Thyroid disease Mother     No Known Allergies  No current outpatient prescriptions on file prior to visit.   No current facility-administered medications on file prior to visit.    BP 116/70 mmHg  Pulse 68  Temp(Src) 97.6 F (36.4 C) (Oral)  Ht 5\' 7"  (1.702  m)  Wt 237 lb 12.8 oz (107.865 kg)  BMI 37.24 kg/m2  SpO2 96%  LMP 07/07/2014      Objective:   Physical Exam  General Mental Status- Alert. General Appearance- Not in acute distress.   Skin General: Color- Normal Color. Moisture- Normal Moisture.  Neck Carotid Arteries- Normal color. Moisture- Normal Moisture. No carotid bruits. No JVD.  Chest and Lung Exam Auscultation: Breath Sounds:-Normal.  Cardiovascular Auscultation:Rythm- Regular. Murmurs & Other Heart Sounds:Auscultation of the heart reveals- No Murmurs.  Abdomen Inspection:-Inspeection Normal. Palpation/Percussion:Note:No mass.  Palpation and Percussion of the abdomen reveal- Non Tender, Non Distended + BS, no rebound or guarding.    Neurologic Cranial Nerve exam:- CN III-XII intact(No nystagmus), symmetric smile.  Strength:- 5/5 equal and symmetric strength both upper and lower extremities.  Rt shoulder- good rom,no crepitus. Mild pain on palpation anterior and posterior.        Assessment & Plan:

## 2014-08-02 NOTE — Assessment & Plan Note (Signed)
For you smoking cessation, I would recommend possibly vaping or nicotine patch or gum. Since Wellbutrin made you feel like a zombie and your reported not feeling like yourself.

## 2014-08-02 NOTE — Patient Instructions (Addendum)
For your depression, I need to get old records to have those on hand. You feel like you don't need depression medication presently. But getting old records would be helpful.  For your gerd. Continue the zantac.  For you smoking cessation, I would recommend possibly vaping or nicotine patch or gum. Since Wellbutrin made you feel like a zombie and your reported not feeling like yourself.  For your high cholesterol recommend setting up cpe in a month or so. Schedule 30 minute appointment and come in fasting.    For your shoulder pain. I rx'd diclofenac and put in order for rt shoulder xray. Do mild rom exercises.

## 2014-08-02 NOTE — Assessment & Plan Note (Signed)
For your high cholesterol recommend setting up cpe in a month or so. Schedule 30 minute appointment and come in fasting.

## 2014-08-20 ENCOUNTER — Emergency Department (HOSPITAL_BASED_OUTPATIENT_CLINIC_OR_DEPARTMENT_OTHER)
Admission: EM | Admit: 2014-08-20 | Discharge: 2014-08-20 | Disposition: A | Discharge status: Home / Self Care | Payer: Self-pay | Attending: Emergency Medicine | Admitting: Emergency Medicine

## 2014-08-20 ENCOUNTER — Encounter (HOSPITAL_BASED_OUTPATIENT_CLINIC_OR_DEPARTMENT_OTHER): Payer: Self-pay | Admitting: *Deleted

## 2014-08-20 DIAGNOSIS — I252 Old myocardial infarction: Secondary | ICD-10-CM | POA: Insufficient documentation

## 2014-08-20 DIAGNOSIS — R197 Diarrhea, unspecified: Secondary | ICD-10-CM

## 2014-08-20 DIAGNOSIS — B349 Viral infection, unspecified: Secondary | ICD-10-CM | POA: Insufficient documentation

## 2014-08-20 DIAGNOSIS — Z791 Long term (current) use of non-steroidal anti-inflammatories (NSAID): Secondary | ICD-10-CM | POA: Insufficient documentation

## 2014-08-20 DIAGNOSIS — Z8659 Personal history of other mental and behavioral disorders: Secondary | ICD-10-CM | POA: Insufficient documentation

## 2014-08-20 DIAGNOSIS — Z720 Tobacco use: Secondary | ICD-10-CM | POA: Insufficient documentation

## 2014-08-20 DIAGNOSIS — R112 Nausea with vomiting, unspecified: Secondary | ICD-10-CM

## 2014-08-20 DIAGNOSIS — Z8719 Personal history of other diseases of the digestive system: Secondary | ICD-10-CM | POA: Insufficient documentation

## 2014-08-20 DIAGNOSIS — Z8639 Personal history of other endocrine, nutritional and metabolic disease: Secondary | ICD-10-CM | POA: Insufficient documentation

## 2014-08-20 DIAGNOSIS — Z3202 Encounter for pregnancy test, result negative: Secondary | ICD-10-CM | POA: Insufficient documentation

## 2014-08-20 LAB — URINALYSIS, ROUTINE W REFLEX MICROSCOPIC
Bilirubin Urine: NEGATIVE
GLUCOSE, UA: NEGATIVE mg/dL
Ketones, ur: NEGATIVE mg/dL
NITRITE: NEGATIVE
PH: 5.5 (ref 5.0–8.0)
Protein, ur: 30 mg/dL — AB
SPECIFIC GRAVITY, URINE: 1.02 (ref 1.005–1.030)
Urobilinogen, UA: 0.2 mg/dL (ref 0.0–1.0)

## 2014-08-20 LAB — URINE MICROSCOPIC-ADD ON

## 2014-08-20 LAB — COMPREHENSIVE METABOLIC PANEL
ALBUMIN: 4.8 g/dL (ref 3.5–5.2)
ALT: 15 U/L (ref 0–35)
ANION GAP: 6 (ref 5–15)
AST: 17 U/L (ref 0–37)
Alkaline Phosphatase: 59 U/L (ref 39–117)
BUN: 10 mg/dL (ref 6–23)
CALCIUM: 9.3 mg/dL (ref 8.4–10.5)
CHLORIDE: 105 mmol/L (ref 96–112)
CO2: 24 mmol/L (ref 19–32)
Creatinine, Ser: 0.54 mg/dL (ref 0.50–1.10)
GFR calc Af Amer: >90 mL/min (ref >90)
GFR calc non Af Amer: >90 mL/min (ref >90)
GLUCOSE: 90 mg/dL (ref 70–99)
POTASSIUM: 3.5 mmol/L (ref 3.5–5.1)
SODIUM: 135 mmol/L (ref 135–145)
TOTAL PROTEIN: 8.5 g/dL — AB (ref 6.0–8.3)
Total Bilirubin: 0.4 mg/dL (ref 0.3–1.2)

## 2014-08-20 LAB — CBC
HCT: 39.4 % (ref 36.0–46.0)
Hemoglobin: 13 g/dL (ref 12.0–15.0)
MCH: 29.6 pg (ref 26.0–34.0)
MCHC: 33 g/dL (ref 30.0–36.0)
MCV: 89.7 fL (ref 78.0–100.0)
Platelets: 250 10*3/uL (ref 150–400)
RBC: 4.39 MIL/uL (ref 3.87–5.11)
RDW: 12.9 % (ref 11.5–15.5)
WBC: 11.6 10*3/uL — AB (ref 4.0–10.5)

## 2014-08-20 LAB — PREGNANCY, URINE: Preg Test, Ur: NEGATIVE

## 2014-08-20 LAB — LIPASE, BLOOD: LIPASE: 27 U/L (ref 11–59)

## 2014-08-20 MED ORDER — SODIUM CHLORIDE 0.9 % IV BOLUS (SEPSIS)
1000.0000 mL | Freq: Once | INTRAVENOUS | Status: AC
  Administered 2014-08-20: 1000 mL via INTRAVENOUS

## 2014-08-20 MED ORDER — ONDANSETRON HCL 4 MG/2ML IJ SOLN
4.0000 mg | Freq: Once | INTRAMUSCULAR | Status: AC
  Administered 2014-08-20: 4 mg via INTRAVENOUS
  Filled 2014-08-20: qty 2

## 2014-08-20 MED ORDER — ONDANSETRON HCL 4 MG PO TABS: 4.0000 mg | ORAL_TABLET | Freq: Four times a day (QID) | ORAL | Status: DC

## 2014-08-20 NOTE — ED Notes (Signed)
Reports intermittent n/v/d x 10 days- emesis x 7 today- constant nausea- denies recent travel

## 2014-08-20 NOTE — ED Notes (Signed)
Pt reports nausea and vomiting is worse. Denies any abdominal pain.

## 2014-08-20 NOTE — Discharge Instructions (Signed)
Take zofran as directed as needed for nausea. Stay well hydrated.  Diarrhea Diarrhea is frequent loose and watery bowel movements. It can cause you to feel weak and dehydrated. Dehydration can cause you to become tired and thirsty, have a dry mouth, and have decreased urination that often is dark yellow. Diarrhea is a sign of another problem, most often an infection that will not last long. In most cases, diarrhea typically lasts 2-3 days. However, it can last longer if it is a sign of something more serious. It is important to treat your diarrhea as directed by your caregiver to lessen or prevent future episodes of diarrhea. CAUSES  Some common causes include:  Gastrointestinal infections caused by viruses, bacteria, or parasites.  Food poisoning or food allergies.  Certain medicines, such as antibiotics, chemotherapy, and laxatives.  Artificial sweeteners and fructose.  Digestive disorders. HOME CARE INSTRUCTIONS  Ensure adequate fluid intake (hydration): Have 1 cup (8 oz) of fluid for each diarrhea episode. Avoid fluids that contain simple sugars or sports drinks, fruit juices, whole milk products, and sodas. Your urine should be clear or pale yellow if you are drinking enough fluids. Hydrate with an oral rehydration solution that you can purchase at pharmacies, retail stores, and online. You can prepare an oral rehydration solution at home by mixing the following ingredients together:   - tsp table salt.   tsp baking soda.   tsp salt substitute containing potassium chloride.  1  tablespoons sugar.  1 L (34 oz) of water.  Certain foods and beverages may increase the speed at which food moves through the gastrointestinal (GI) tract. These foods and beverages should be avoided and include:  Caffeinated and alcoholic beverages.  High-fiber foods, such as raw fruits and vegetables, nuts, seeds, and whole grain breads and cereals.  Foods and beverages sweetened with sugar alcohols,  such as xylitol, sorbitol, and mannitol.  Some foods may be well tolerated and may help thicken stool including:  Starchy foods, such as rice, toast, pasta, low-sugar cereal, oatmeal, grits, baked potatoes, crackers, and bagels.  Bananas.  Applesauce.  Add probiotic-rich foods to help increase healthy bacteria in the GI tract, such as yogurt and fermented milk products.  Wash your hands well after each diarrhea episode.  Only take over-the-counter or prescription medicines as directed by your caregiver.  Take a warm bath to relieve any burning or pain from frequent diarrhea episodes. SEEK IMMEDIATE MEDICAL CARE IF:   You are unable to keep fluids down.  You have persistent vomiting.  You have blood in your stool, or your stools are black and tarry.  You do not urinate in 6-8 hours, or there is only a small amount of very dark urine.  You have abdominal pain that increases or localizes.  You have weakness, dizziness, confusion, or light-headedness.  You have a severe headache.  Your diarrhea gets worse or does not get better.  You have a fever or persistent symptoms for more than 2-3 days.  You have a fever and your symptoms suddenly get worse. MAKE SURE YOU:   Understand these instructions.  Will watch your condition.  Will get help right away if you are not doing well or get worse. Document Released: 06/08/2002 Document Revised: 11/02/2013 Document Reviewed: 02/24/2012 [facility] Patient Information 2015 [place], [place]. This information is not intended to replace advice given to you by your health care provider. Make sure you discuss any questions you have with your health care provider.  Food Choices to Help  Relieve Diarrhea When you have diarrhea, the foods you eat and your eating habits are very important. Choosing the right foods and drinks can help relieve diarrhea. Also, because diarrhea can last up to 7 days, you need to replace lost fluids and electrolytes  (such as sodium, potassium, and chloride) in order to help prevent dehydration.  WHAT GENERAL GUIDELINES DO I NEED TO FOLLOW?  Slowly drink 1 cup (8 oz) of fluid for each episode of diarrhea. If you are getting enough fluid, your urine will be clear or pale yellow.  Eat starchy foods. Some good choices include [place]ante rice, [place] toast, pasta, low-fiber cereal, baked potatoes (without the skin), saltine crackers, and bagels.  Avoid large servings of any cooked vegetables.  Limit fruit to two servings per day. A serving is  cup or 1 small piece.  Choose foods with less than 2 g of fiber per serving.  Limit fats to less than 8 tsp (38 g) per day.  Avoid fried foods.  Eat foods that have probiotics in them. Probiotics can be found in certain dairy products.  Avoid foods and beverages that may increase the speed at which food moves through the stomach and intestines (gastrointestinal tract). Things to avoid include:  High-fiber foods, such as dried fruit, raw fruits and vegetables, nuts, seeds, and whole grain foods.  Spicy foods and high-fat foods.  Foods and beverages sweetened with high-fructose corn syrup, honey, or sugar alcohols such as xylitol, sorbitol, and mannitol. WHAT FOODS ARE RECOMMENDED? Grains Maricle rice. Balicki, [place], or pita breads (fresh or toasted), including plain rolls, buns, or bagels. Bartolomei pasta. Saltine, soda, or graham crackers. Pretzels. Low-fiber cereal. Cooked cereals made with water (such as cornmeal, farina, or cream cereals). Plain muffins. Matzo. Melba toast. Zwieback.  Vegetables Potatoes (without the skin). Strained tomato and vegetable juices. Most well-cooked and canned vegetables without seeds. Tender lettuce. Fruits Cooked or canned applesauce, apricots, cherries, fruit cocktail, grapefruit, peaches, pears, or plums. Fresh bananas, apples without skin, cherries, grapes, cantaloupe, grapefruit, peaches, oranges, or plums.  Meat and Other Protein  Products Baked or boiled chicken. Eggs. Tofu. Fish. Seafood. Smooth peanut butter. Ground or well-cooked tender beef, ham, veal, lamb, pork, or poultry.  Dairy Plain yogurt, kefir, and unsweetened liquid yogurt. Lactose-free milk, buttermilk, or soy milk. Plain hard cheese. Beverages Sport drinks. Clear broths. Diluted fruit juices (except prune). Regular, caffeine-free sodas such as ginger ale. Water. Decaffeinated teas. Oral rehydration solutions. Sugar-free beverages not sweetened with sugar alcohols. Other Bouillon, broth, or soups made from recommended foods.  The items listed above may not be a complete list of recommended foods or beverages. Contact your dietitian for more options. WHAT FOODS ARE NOT RECOMMENDED? Grains Whole grain, whole wheat, bran, or rye breads, rolls, pastas, crackers, and cereals. Wild or brown rice. Cereals that contain more than 2 g of fiber per serving. Corn tortillas or taco shells. Cooked or dry oatmeal. Granola. Popcorn. Vegetables Raw vegetables. Cabbage, broccoli, Brussels sprouts, artichokes, baked beans, beet greens, corn, kale, legumes, peas, sweet potatoes, and yams. Potato skins. Cooked spinach and cabbage. Fruits Dried fruit, including raisins and dates. Raw fruits. Stewed or dried prunes. Fresh apples with skin, apricots, mangoes, pears, raspberries, and strawberries.  Meat and Other Protein Products Chunky peanut butter. Nuts and seeds. Beans and lentils. Tomasa Blase.  Dairy High-fat cheeses. Milk, chocolate milk, and beverages made with milk, such as milk shakes. Cream. Ice cream. Sweets and Desserts Sweet rolls, doughnuts, and sweet breads. Pancakes and waffles. Fats  and Oils Butter. Cream sauces. Margarine. Salad oils. Plain salad dressings. Olives. Avocados.  Beverages Caffeinated beverages (such as coffee, tea, soda, or energy drinks). Alcoholic beverages. Fruit juices with pulp. Prune juice. Soft drinks sweetened with high-fructose corn syrup or  sugar alcohols. Other Coconut. Hot sauce. Chili powder. Mayonnaise. Gravy. Cream-based or milk-based soups.  The items listed above may not be a complete list of foods and beverages to avoid. Contact your dietitian for more information. WHAT SHOULD I DO IF I BECOME DEHYDRATED? Diarrhea can sometimes lead to dehydration. Signs of dehydration include dark urine and dry mouth and skin. If you think you are dehydrated, you should rehydrate with an oral rehydration solution. These solutions can be purchased at pharmacies, retail stores, or online.  Drink -1 cup (120-240 mL) of oral rehydration solution each time you have an episode of diarrhea. If drinking this amount makes your diarrhea worse, try drinking smaller amounts more often. For example, drink 1-3 tsp (5-15 mL) every 5-10 minutes.  A general rule for staying hydrated is to drink 1-2 L of fluid per day. Talk to your health care provider about the specific amount you should be drinking each day. Drink enough fluids to keep your urine clear or pale yellow. Document Released: 09/08/2003 Document Revised: 06/23/2013 Document Reviewed: 05/11/2013 [facility] Patient Information 2015 [place], [place]. This information is not intended to replace advice given to you by your health care provider. Make sure you discuss any questions you have with your health care provider.  Nausea and Vomiting Nausea is a sick feeling that often comes before throwing up (vomiting). Vomiting is a reflex where stomach contents come out of your mouth. Vomiting can cause severe loss of body fluids (dehydration). Children and elderly adults can become dehydrated quickly, especially if they also have diarrhea. Nausea and vomiting are symptoms of a condition or disease. It is important to find the cause of your symptoms. CAUSES   Direct irritation of the stomach lining. This irritation can result from increased acid production (gastroesophageal reflux disease), infection, food  poisoning, taking certain medicines (such as nonsteroidal anti-inflammatory drugs), alcohol use, or tobacco use.  Signals from the brain.These signals could be caused by a headache, heat exposure, an inner ear disturbance, increased pressure in the brain from injury, infection, a tumor, or a concussion, pain, emotional stimulus, or metabolic problems.  An obstruction in the gastrointestinal tract (bowel obstruction).  Illnesses such as diabetes, hepatitis, gallbladder problems, appendicitis, kidney problems, cancer, sepsis, atypical symptoms of a heart attack, or eating disorders.  Medical treatments such as chemotherapy and radiation.  Receiving medicine that makes you sleep (general anesthetic) during surgery. DIAGNOSIS Your caregiver may ask for tests to be done if the problems do not improve after a few days. Tests may also be done if symptoms are severe or if the reason for the nausea and vomiting is not clear. Tests may include:  Urine tests.  Blood tests.  Stool tests.  Cultures (to look for evidence of infection).  X-rays or other imaging studies. Test results can help your caregiver make decisions about treatment or the need for additional tests. TREATMENT You need to stay well hydrated. Drink frequently but in small amounts.You may wish to drink water, sports drinks, clear broth, or eat frozen ice pops or gelatin dessert to help stay hydrated.When you eat, eating slowly may help prevent nausea.There are also some antinausea medicines that may help prevent nausea. HOME CARE INSTRUCTIONS   Take all medicine as directed by your  caregiver.  If you do not have an appetite, do not force yourself to eat. However, you must continue to drink fluids.  If you have an appetite, eat a normal diet unless your caregiver tells you differently.  Eat a variety of complex carbohydrates (rice, wheat, potatoes, bread), lean meats, yogurt, fruits, and vegetables.  Avoid high-fat foods  because they are more difficult to digest.  Drink enough water and fluids to keep your urine clear or pale yellow.  If you are dehydrated, ask your caregiver for specific rehydration instructions. Signs of dehydration may include:  Severe thirst.  Dry lips and mouth.  Dizziness.  Dark urine.  Decreasing urine frequency and amount.  Confusion.  Rapid breathing or pulse. SEEK IMMEDIATE MEDICAL CARE IF:   You have blood or brown flecks (like coffee grounds) in your vomit.  You have black or bloody stools.  You have a severe headache or stiff neck.  You are confused.  You have severe abdominal pain.  You have chest pain or trouble breathing.  You do not urinate at least once every 8 hours.  You develop cold or clammy skin.  You continue to vomit for longer than 24 to 48 hours.  You have a fever. MAKE SURE YOU:   Understand these instructions.  Will watch your condition.  Will get help right away if you are not doing well or get worse. Document Released: 06/18/2005 Document Revised: 09/10/2011 Document Reviewed: 11/15/2010 [facility] Patient Information 2015 [place]. This information is not intended to replace advice given to you by your health care provider. Make sure you discuss any questions you have with your health care provider.  Viral Infections A viral infection can be caused by different types of viruses.Most viral infections are not serious and resolve on their own. However, some infections may cause severe symptoms and may lead to further complications. SYMPTOMS Viruses can frequently cause:  Minor sore throat.  Aches and pains.  Headaches.  Runny nose.  Different types of rashes.  Watery eyes.  Tiredness.  Cough.  Loss of appetite.  Gastrointestinal infections, resulting in nausea, vomiting, and diarrhea. These symptoms do not respond to antibiotics because the infection is not caused by bacteria. However, you might catch a  bacterial infection following the viral infection. This is sometimes called a "superinfection." Symptoms of such a bacterial infection may include:  Worsening sore throat with pus and difficulty swallowing.  Swollen neck glands.  Chills and a high or persistent fever.  Severe headache.  Tenderness over the sinuses.  Persistent overall ill feeling (malaise), muscle aches, and tiredness (fatigue).  Persistent cough.  Yellow, green, or brown mucus production with coughing. HOME CARE INSTRUCTIONS   Only take over-the-counter or prescription medicines for pain, discomfort, diarrhea, or fever as directed by your caregiver.  Drink enough water and fluids to keep your urine clear or pale yellow. Sports drinks can provide valuable electrolytes, sugars, and hydration.  Get plenty of rest and maintain proper nutrition. Soups and broths with crackers or rice are fine. SEEK IMMEDIATE MEDICAL CARE IF:   You have severe headaches, shortness of breath, chest pain, neck pain, or an unusual rash.  You have uncontrolled vomiting, diarrhea, or you are unable to keep down fluids.  You or your child has an oral temperature above 102 F (38.9 C), not controlled by medicine.  Your baby is older than 3 months with a rectal temperature of 102 F (38.9 C) or higher.  Your baby is 863 months old  or younger with a rectal temperature of 100.4 F (38 C) or higher. MAKE SURE YOU:   Understand these instructions.  Will watch your condition.  Will get help right away if you are not doing well or get worse. Document Released: 03/28/2005 Document Revised: 09/10/2011 Document Reviewed: 10/23/2010 [facility] Patient Information 2015 [place], [place]. This information is not intended to replace advice given to you by your health care provider. Make sure you discuss any questions you have with your health care provider.

## 2014-08-20 NOTE — ED Provider Notes (Signed)
CSN: [id number]     Arrival date & time 08/20/14  1135 History   First MD Initiated Contact with Patient 08/20/14 1208     Chief Complaint  Patient presents with  . Emesis     (Consider location/radiation/quality/duration/timing/severity/associated sxs/prior Treatment) HPI Comments: 34 y/o F presenting with nausea, vomiting and diarrhea x 10 days, worsening today. Reports nausea usually begins early in the morning and subsides throughout the day. She's only had a few episodes of vomiting until today, when she was at work, ate a biscuit and has been vomiting since, reports 7 episodes of bilious, non-bloody emesis. Nausea has remained constant today. Denies any abdominal pain. Admits to a few episodes of non-bloody, "runny" diarrhea. Denies recent travel or sick contacts, however states she works in [employer] and is around a lot of people. Hx of cholecystectomy. LMP began about 2 weeks ago and lasted 4 days. Denies vaginal d/c. No urinary symptoms.  Patient is a 34 y.o. female presenting with vomiting. The history is provided by the patient.  Emesis Associated symptoms: abdominal pain and diarrhea     Past Medical History  Diagnosis Date  . Depression   . GERD (gastroesophageal reflux disease)   . Hyperlipidemia   . Myocardial infarction    Past Surgical History  Procedure Laterality Date  . Cholecystectomy    . Tonsillectomy and adenoidectomy     Family History  Problem Relation Age of Onset  . Diabetes Mother   . Thyroid disease Mother    History  Substance Use Topics  . Smoking status: Current Every Day Smoker -- 0.25 packs/day for 14 years  . Smokeless tobacco: Never Used  . Alcohol Use: Yes     Comment: ocassionally. 3 times a month. 2-3 glasses wine, beer, or mixed drink.    OB History    No data available     Review of Systems  Gastrointestinal: Positive for nausea, vomiting, abdominal pain and diarrhea.  All other systems reviewed and are  negative.     Allergies  Review of patient's allergies indicates no known allergies.  Home Medications   Prior to Admission medications   Medication Sig Start Date End Date Taking? Authorizing Provider  diclofenac (VOLTAREN) 75 MG EC tablet Take 1 tablet (75 mg total) by mouth 2 (two) times daily. 08/02/14  Yes Bayard Beaver Saguier, PA-C  ondansetron (ZOFRAN) 4 MG tablet Take 1 tablet (4 mg total) by mouth every 6 (six) hours. 08/20/14   Paulino Cork M Valisha Heslin, PA-C   BP 147/94 mmHg  Pulse 75  Temp(Src) 98.1 F (36.7 C) (Oral)  Resp 16  SpO2 100%  LMP 08/08/2014 Physical Exam  Constitutional: She is oriented to person, place, and time. She appears well-developed and well-nourished. No distress.  HENT:  Head: Normocephalic and atraumatic.  Mouth/Throat: Oropharynx is clear and moist.  Eyes: Conjunctivae are normal. No scleral icterus.  Neck: Normal range of motion. Neck supple.  Cardiovascular: Normal rate, regular rhythm and normal heart sounds.   Pulmonary/Chest: Effort normal and breath sounds normal.  Abdominal: Soft. Bowel sounds are normal. She exhibits no distension. There is no tenderness.  Mild suprapubic and LLQ tenderness. No peritoneal signs.  Musculoskeletal: Normal range of motion. She exhibits no edema.  Neurological: She is alert and oriented to person, place, and time.  Skin: Skin is warm and dry. She is not diaphoretic.  Psychiatric: She has a normal mood and affect. Her behavior is normal.    ED Course  Procedures (including critical  care time) Labs Review Labs Reviewed  URINALYSIS, ROUTINE W REFLEX MICROSCOPIC - Abnormal; Notable for the following:    Hgb urine dipstick MODERATE (*)    Protein, ur 30 (*)    Leukocytes, UA SMALL (*)    All other components within normal limits  CBC - Abnormal; Notable for the following:    WBC 11.6 (*)    All other components within normal limits  COMPREHENSIVE METABOLIC PANEL - Abnormal; Notable for the following:    Total  Protein 8.5 (*)    All other components within normal limits  URINE MICROSCOPIC-ADD ON - Abnormal; Notable for the following:    Bacteria, UA FEW (*)    All other components within normal limits  PREGNANCY, URINE  LIPASE, BLOOD    Imaging Review No results found.   EKG Interpretation None      MDM   Final diagnoses:  Non-intractable vomiting with nausea, vomiting of unspecified type  Diarrhea  Viral syndrome   NAD. AFVSS. Abdomen soft with no peritoneal signs. Labs with mild leukocytosis. UA equivocal, culture pending. After IV fluids and zofran, pt reports significant improvement. Abdomen remains soft, no tenderness on re-eval. Most likely viral illness. Advised BRAT diet, rest, hydration. Stable for d/c. F/u with PCP. D/c with rx zofran. Return precautions given. Patient states understanding of treatment care plan and is agreeable.  Kathrynn SpeedRobyn M Samba Cumba, PA-C 08/20/14 1315  Toy BakerAnthony T Allen, MD 08/21/14 [phone]

## 2014-08-20 NOTE — ED Notes (Signed)
Pt directed to pharmacy to pick up meds- note for work given- d/c with ride

## 2014-08-21 LAB — URINE CULTURE: Colony Count: 85000

## 2014-09-09 ENCOUNTER — Encounter: Payer: Self-pay | Admitting: Medical

## 2014-09-09 ENCOUNTER — Ambulatory Visit (INDEPENDENT_AMBULATORY_CARE_PROVIDER_SITE_OTHER): Payer: 59 | Admitting: Medical

## 2014-09-09 ENCOUNTER — Other Ambulatory Visit (HOSPITAL_COMMUNITY)
Admission: RE | Admit: 2014-09-09 | Discharge: 2014-09-09 | Disposition: A | Discharge status: Home / Self Care | Payer: 59 | Source: Ambulatory Visit | Attending: Medical | Admitting: Medical

## 2014-09-09 VITALS — BP 124/79 | HR 80 | Temp 98.4°F | Ht 67.0 in | Wt 234.0 lb

## 2014-09-09 DIAGNOSIS — N390 Urinary tract infection, site not specified: Secondary | ICD-10-CM

## 2014-09-09 DIAGNOSIS — Z01419 Encounter for gynecological examination (general) (routine) without abnormal findings: Secondary | ICD-10-CM | POA: Diagnosis not present

## 2014-09-09 DIAGNOSIS — Z1151 Encounter for screening for human papillomavirus (HPV): Secondary | ICD-10-CM | POA: Insufficient documentation

## 2014-09-09 DIAGNOSIS — N760 Acute vaginitis: Secondary | ICD-10-CM | POA: Diagnosis present

## 2014-09-09 DIAGNOSIS — Z0000 Encounter for general adult medical examination without abnormal findings: Secondary | ICD-10-CM

## 2014-09-09 DIAGNOSIS — Z23 Encounter for immunization: Secondary | ICD-10-CM

## 2014-09-09 DIAGNOSIS — R82998 Other abnormal findings in urine: Secondary | ICD-10-CM

## 2014-09-09 DIAGNOSIS — Z113 Encounter for screening for infections with a predominantly sexual mode of transmission: Secondary | ICD-10-CM | POA: Insufficient documentation

## 2014-09-09 DIAGNOSIS — Z202 Contact with and (suspected) exposure to infections with a predominantly sexual mode of transmission: Secondary | ICD-10-CM

## 2014-09-09 LAB — POCT URINALYSIS DIPSTICK
Bilirubin, UA: NEGATIVE
Blood, UA: 7
Glucose, UA: NEGATIVE
KETONES UA: NEGATIVE
Nitrite, UA: NEGATIVE
Protein, UA: 1
Spec Grav, UA: 1.025
Urobilinogen, UA: 0.2
pH, UA: 7

## 2014-09-09 LAB — CBC WITH DIFFERENTIAL/PLATELET
BASOS ABS: 0.1 10*3/uL (ref 0.0–0.1)
Basophils Relative: 0.6 % (ref 0.0–3.0)
EOS ABS: 0.2 10*3/uL (ref 0.0–0.7)
Eosinophils Relative: 2.2 % (ref 0.0–5.0)
HCT: 37.1 % (ref 36.0–46.0)
HEMOGLOBIN: 12.5 g/dL (ref 12.0–15.0)
LYMPHS PCT: 37.8 % (ref 12.0–46.0)
Lymphs Abs: 3.4 10*3/uL (ref 0.7–4.0)
MCHC: 33.8 g/dL (ref 30.0–36.0)
MCV: 87.2 fl (ref 78.0–100.0)
MONO ABS: 0.4 10*3/uL (ref 0.1–1.0)
Monocytes Relative: 4.9 % (ref 3.0–12.0)
NEUTROS ABS: 4.9 10*3/uL (ref 1.4–7.7)
Neutrophils Relative %: 54.5 % (ref 43.0–77.0)
Platelets: 264 10*3/uL (ref 150.0–400.0)
RBC: 4.26 Mil/uL (ref 3.87–5.11)
RDW: 13.4 % (ref 11.5–15.5)
WBC: 9 10*3/uL (ref 4.0–10.5)

## 2014-09-09 LAB — RPR

## 2014-09-09 LAB — COMPREHENSIVE METABOLIC PANEL
ALBUMIN: 4.5 g/dL (ref 3.5–5.2)
ALT: 12 U/L (ref 0–35)
AST: 15 U/L (ref 0–37)
Alkaline Phosphatase: 50 U/L (ref 39–117)
BUN: 11 mg/dL (ref 6–23)
CALCIUM: 9.2 mg/dL (ref 8.4–10.5)
CO2: 31 mEq/L (ref 19–32)
Chloride: 104 mEq/L (ref 96–112)
Creatinine, Ser: 0.76 mg/dL (ref 0.40–1.20)
GFR: 113.52 mL/min (ref >60.00)
Glucose, Bld: 78 mg/dL (ref 70–99)
POTASSIUM: 3.4 meq/L — AB (ref 3.5–5.1)
SODIUM: 138 meq/L (ref 135–145)
TOTAL PROTEIN: 7.8 g/dL (ref 6.0–8.3)
Total Bilirubin: 0.5 mg/dL (ref 0.2–1.2)

## 2014-09-09 LAB — LIPID PANEL
Cholesterol: 179 mg/dL (ref 0–200)
HDL: 34.2 mg/dL — AB (ref >39.00)
LDL Cholesterol: 130 mg/dL — ABNORMAL HIGH (ref 0–99)
NONHDL: 144.8
Total CHOL/HDL Ratio: 5
Triglycerides: 73 mg/dL (ref 0.0–149.0)
VLDL: 14.6 mg/dL (ref 0.0–40.0)

## 2014-09-09 LAB — HIV ANTIBODY (ROUTINE TESTING W REFLEX): HIV 1&2 Ab, 4th Generation: NONREACTIVE

## 2014-09-09 NOTE — Addendum Note (Signed)
Addended by: Lurline HareARTER, Enio Hornback E on: 09/09/2014 04:08 PM   Modules accepted: Orders

## 2014-09-09 NOTE — Progress Notes (Signed)
Pre visit review using our clinic review tool, if applicable. No additional management support is needed unless otherwise documented below in the visit note. 

## 2014-09-09 NOTE — Patient Instructions (Signed)
Wellness examination Cbc, cmp, tsh, lipid, ua, std ancillary studies including hiv, and pap.     Tdap given today. Pt declined flu vaccine.    Preventive Care for Adults A healthy lifestyle and preventive care can promote health and wellness. Preventive health guidelines for women include the following key practices.  A routine yearly physical is a good way to check with your health care provider about your health and preventive screening. It is a chance to share any concerns and updates on your health and to receive a thorough exam.  Visit your dentist for a routine exam and preventive care every 6 months. Brush your teeth twice a day and floss once a day. Good oral hygiene prevents tooth decay and gum disease.  The frequency of eye exams is based on your age, health, family medical history, use of contact lenses, and other factors. Follow your health care provider's recommendations for frequency of eye exams.  Eat a healthy diet. Foods like vegetables, fruits, whole grains, low-fat dairy products, and lean protein foods contain the nutrients you need without too many calories. Decrease your intake of foods high in solid fats, added sugars, and salt. Eat the right amount of calories for you.Get information about a proper diet from your health care provider, if necessary.  Regular physical exercise is one of the most important things you can do for your health. Most adults should get at least 150 minutes of moderate-intensity exercise (any activity that increases your heart rate and causes you to sweat) each week. In addition, most adults need muscle-strengthening exercises on 2 or more days a week.  Maintain a healthy weight. The body mass index (BMI) is a screening tool to identify possible weight problems. It provides an estimate of body fat based on height and weight. Your health care provider can find your BMI and can help you achieve or maintain a healthy weight.For adults 20 years and  older:  A BMI below 18.5 is considered underweight.  A BMI of 18.5 to 24.9 is normal.  A BMI of 25 to 29.9 is considered overweight.  A BMI of 30 and above is considered obese.  Maintain normal blood lipids and cholesterol levels by exercising and minimizing your intake of saturated fat. Eat a balanced diet with plenty of fruit and vegetables. Blood tests for lipids and cholesterol should begin at age 29 and be repeated every 5 years. If your lipid or cholesterol levels are high, you are over 50, or you are at high risk for heart disease, you may need your cholesterol levels checked more frequently.Ongoing high lipid and cholesterol levels should be treated with medicines if diet and exercise are not working.  If you smoke, find out from your health care provider how to quit. If you do not use tobacco, do not start.  Lung cancer screening is recommended for adults aged 57-80 years who are at high risk for developing lung cancer because of a history of smoking. A yearly low-dose CT scan of the lungs is recommended for people who have at least a 30-pack-year history of smoking and are a current smoker or have quit within the past 15 years. A pack year of smoking is smoking an average of 1 pack of cigarettes a day for 1 year (for example: 1 pack a day for 30 years or 2 packs a day for 15 years). Yearly screening should continue until the smoker has stopped smoking for at least 15 years. Yearly screening should be stopped for  people who develop a health problem that would prevent them from having lung cancer treatment.  If you are pregnant, do not drink alcohol. If you are breastfeeding, be very cautious about drinking alcohol. If you are not pregnant and choose to drink alcohol, do not have more than 1 drink per day. One drink is considered to be 12 ounces (355 mL) of beer, 5 ounces (148 mL) of wine, or 1.5 ounces (44 mL) of liquor.  Avoid use of street drugs. Do not share needles with anyone. Ask  for help if you need support or instructions about stopping the use of drugs.  High blood pressure causes heart disease and increases the risk of stroke. Your blood pressure should be checked at least every 1 to 2 years. Ongoing high blood pressure should be treated with medicines if weight loss and exercise do not work.  If you are 83-86 years old, ask your health care provider if you should take aspirin to prevent strokes.  Diabetes screening involves taking a blood sample to check your fasting blood sugar level. This should be done once every 3 years, after age 50, if you are within normal weight and without risk factors for diabetes. Testing should be considered at a younger age or be carried out more frequently if you are overweight and have at least 1 risk factor for diabetes.  Breast cancer screening is essential preventive care for women. You should practice "breast self-awareness." This means understanding the normal appearance and feel of your breasts and may include breast self-examination. Any changes detected, no matter how small, should be reported to a health care provider. Women in their 31s and 30s should have a clinical breast exam (CBE) by a health care provider as part of a regular health exam every 1 to 3 years. After age 51, women should have a CBE every year. Starting at age 3, women should consider having a mammogram (breast X-ray test) every year. Women who have a family history of breast cancer should talk to their health care provider about genetic screening. Women at a high risk of breast cancer should talk to their health care providers about having an MRI and a mammogram every year.  Breast cancer gene (BRCA)-related cancer risk assessment is recommended for women who have family members with BRCA-related cancers. BRCA-related cancers include breast, ovarian, tubal, and peritoneal cancers. Having family members with these cancers may be associated with an increased risk for  harmful changes (mutations) in the breast cancer genes BRCA1 and BRCA2. Results of the assessment will determine the need for genetic counseling and BRCA1 and BRCA2 testing.  Routine pelvic exams to screen for cancer are no longer recommended for nonpregnant women who are considered low risk for cancer of the pelvic organs (ovaries, uterus, and vagina) and who do not have symptoms. Ask your health care provider if a screening pelvic exam is right for you.  If you have had past treatment for cervical cancer or a condition that could lead to cancer, you need Pap tests and screening for cancer for at least 20 years after your treatment. If Pap tests have been discontinued, your risk factors (such as having a new sexual partner) need to be reassessed to determine if screening should be resumed. Some women have medical problems that increase the chance of getting cervical cancer. In these cases, your health care provider may recommend more frequent screening and Pap tests.  The HPV test is an additional test that may be used for  cervical cancer screening. The HPV test looks for the virus that can cause the cell changes on the cervix. The cells collected during the Pap test can be tested for HPV. The HPV test could be used to screen women aged 33 years and older, and should be used in women of any age who have unclear Pap test results. After the age of 67, women should have HPV testing at the same frequency as a Pap test.  Colorectal cancer can be detected and often prevented. Most routine colorectal cancer screening begins at the age of 67 years and continues through age 62 years. However, your health care provider may recommend screening at an earlier age if you have risk factors for colon cancer. On a yearly basis, your health care provider may provide home test kits to check for hidden blood in the stool. Use of a small camera at the end of a tube, to directly examine the colon (sigmoidoscopy or colonoscopy),  can detect the earliest forms of colorectal cancer. Talk to your health care provider about this at age 23, when routine screening begins. Direct exam of the colon should be repeated every 5-10 years through age 4 years, unless early forms of pre-cancerous polyps or small growths are found.  People who are at an increased risk for hepatitis B should be screened for this virus. You are considered at high risk for hepatitis B if:  You were born in a country where hepatitis B occurs often. Talk with your health care provider about which countries are considered high risk.  Your parents were born in a high-risk country and you have not received a shot to protect against hepatitis B (hepatitis B vaccine).  You have HIV or AIDS.  You use needles to inject street drugs.  You live with, or have sex with, someone who has hepatitis B.  You get hemodialysis treatment.  You take certain medicines for conditions like cancer, organ transplantation, and autoimmune conditions.  Hepatitis C blood testing is recommended for all people born from [birth year] through 1965 and any individual with known risks for hepatitis C.  Practice safe sex. Use condoms and avoid high-risk sexual practices to reduce the spread of sexually transmitted infections (STIs). STIs include gonorrhea, chlamydia, syphilis, trichomonas, herpes, HPV, and human immunodeficiency virus (HIV). Herpes, HIV, and HPV are viral illnesses that have no cure. They can result in disability, cancer, and death.  You should be screened for sexually transmitted illnesses (STIs) including gonorrhea and chlamydia if:  You are sexually active and are younger than 24 years.  You are older than 24 years and your health care provider tells you that you are at risk for this type of infection.  Your sexual activity has changed since you were last screened and you are at an increased risk for chlamydia or gonorrhea. Ask your health care provider if you are at  risk.  If you are at risk of being infected with HIV, it is recommended that you take a prescription medicine daily to prevent HIV infection. This is called preexposure prophylaxis (PrEP). You are considered at risk if:  You are a heterosexual woman, are sexually active, and are at increased risk for HIV infection.  You take drugs by injection.  You are sexually active with a partner who has HIV.  Talk with your health care provider about whether you are at high risk of being infected with HIV. If you choose to begin PrEP, you should first be tested for HIV. You  should then be tested every 3 months for as long as you are taking PrEP.  Osteoporosis is a disease in which the bones lose minerals and strength with aging. This can result in serious bone fractures or breaks. The risk of osteoporosis can be identified using a bone density scan. Women ages 87 years and over and women at risk for fractures or osteoporosis should discuss screening with their health care providers. Ask your health care provider whether you should take a calcium supplement or vitamin D to reduce the rate of osteoporosis.  Menopause can be associated with physical symptoms and risks. Hormone replacement therapy is available to decrease symptoms and risks. You should talk to your health care provider about whether hormone replacement therapy is right for you.  Use sunscreen. Apply sunscreen liberally and repeatedly throughout the day. You should seek shade when your shadow is shorter than you. Protect yourself by wearing long sleeves, pants, a wide-brimmed hat, and sunglasses year round, whenever you are outdoors.  Once a month, do a whole body skin exam, using a mirror to look at the skin on your back. Tell your health care provider of new moles, moles that have irregular borders, moles that are larger than a pencil eraser, or moles that have changed in shape or color.  Stay current with required vaccines  (immunizations).  Influenza vaccine. All adults should be immunized every year.  Tetanus, diphtheria, and acellular pertussis (Td, Tdap) vaccine. Pregnant women should receive 1 dose of Tdap vaccine during each pregnancy. The dose should be obtained regardless of the length of time since the last dose. Immunization is preferred during the 27th-36th week of gestation. An adult who has not previously received Tdap or who does not know her vaccine status should receive 1 dose of Tdap. This initial dose should be followed by tetanus and diphtheria toxoids (Td) booster doses every 10 years. Adults with an unknown or incomplete history of completing a 3-dose immunization series with Td-containing vaccines should begin or complete a primary immunization series including a Tdap dose. Adults should receive a Td booster every 10 years.  Varicella vaccine. An adult without evidence of immunity to varicella should receive 2 doses or a second dose if she has previously received 1 dose. Pregnant females who do not have evidence of immunity should receive the first dose after pregnancy. This first dose should be obtained before leaving the health care facility. The second dose should be obtained 4-8 weeks after the first dose.  Human papillomavirus (HPV) vaccine. Females aged 13-26 years who have not received the vaccine previously should obtain the 3-dose series. The vaccine is not recommended for use in pregnant females. However, pregnancy testing is not needed before receiving a dose. If a female is found to be pregnant after receiving a dose, no treatment is needed. In that case, the remaining doses should be delayed until after the pregnancy. Immunization is recommended for any person with an immunocompromised condition through the age of 16 years if she did not get any or all doses earlier. During the 3-dose series, the second dose should be obtained 4-8 weeks after the first dose. The third dose should be obtained  24 weeks after the first dose and 16 weeks after the second dose.  Zoster vaccine. One dose is recommended for adults aged 81 years or older unless certain conditions are present.  Measles, mumps, and rubella (MMR) vaccine. Adults born before 2 generally are considered immune to measles and mumps. Adults born in  1957 or later should have 1 or more doses of MMR vaccine unless there is a contraindication to the vaccine or there is laboratory evidence of immunity to each of the three diseases. A routine second dose of MMR vaccine should be obtained at least 28 days after the first dose for students attending postsecondary schools, health care workers, or international travelers. People who received inactivated measles vaccine or an unknown type of measles vaccine during 1963-1967 should receive 2 doses of MMR vaccine. People who received inactivated mumps vaccine or an unknown type of mumps vaccine before 1979 and are at high risk for mumps infection should consider immunization with 2 doses of MMR vaccine. For females of childbearing age, rubella immunity should be determined. If there is no evidence of immunity, females who are not pregnant should be vaccinated. If there is no evidence of immunity, females who are pregnant should delay immunization until after pregnancy. Unvaccinated health care workers born before [birth year] who lack laboratory evidence of measles, mumps, or rubella immunity or laboratory confirmation of disease should consider measles and mumps immunization with 2 doses of MMR vaccine or rubella immunization with 1 dose of MMR vaccine.  Pneumococcal 13-valent conjugate (PCV13) vaccine. When indicated, a person who is uncertain of her immunization history and has no record of immunization should receive the PCV13 vaccine. An adult aged 74 years or older who has certain medical conditions and has not been previously immunized should receive 1 dose of PCV13 vaccine. This PCV13 should be followed  with a dose of pneumococcal polysaccharide (PPSV23) vaccine. The PPSV23 vaccine dose should be obtained at least 8 weeks after the dose of PCV13 vaccine. An adult aged 36 years or older who has certain medical conditions and previously received 1 or more doses of PPSV23 vaccine should receive 1 dose of PCV13. The PCV13 vaccine dose should be obtained 1 or more years after the last PPSV23 vaccine dose.  Pneumococcal polysaccharide (PPSV23) vaccine. When PCV13 is also indicated, PCV13 should be obtained first. All adults aged 73 years and older should be immunized. An adult younger than age 27 years who has certain medical conditions should be immunized. Any person who resides in a nursing home or long-term care facility should be immunized. An adult smoker should be immunized. People with an immunocompromised condition and certain other conditions should receive both PCV13 and PPSV23 vaccines. People with human immunodeficiency virus (HIV) infection should be immunized as soon as possible after diagnosis. Immunization during chemotherapy or radiation therapy should be avoided. Routine use of PPSV23 vaccine is not recommended for American Indians, [place] Natives, or people younger than 65 years unless there are medical conditions that require PPSV23 vaccine. When indicated, people who have unknown immunization and have no record of immunization should receive PPSV23 vaccine. One-time revaccination 5 years after the first dose of PPSV23 is recommended for people aged 19-64 years who have chronic kidney failure, nephrotic syndrome, asplenia, or immunocompromised conditions. People who received 1-2 doses of PPSV23 before age 79 years should receive another dose of PPSV23 vaccine at age 63 years or later if at least 5 years have passed since the previous dose. Doses of PPSV23 are not needed for people immunized with PPSV23 at or after age 1 years.  Meningococcal vaccine. Adults with asplenia or persistent complement  component deficiencies should receive 2 doses of quadrivalent meningococcal conjugate (MenACWY-D) vaccine. The doses should be obtained at least 2 months apart. Microbiologists working with certain meningococcal bacteria, [employer] recruits, people at risk  during an outbreak, and people who travel to or live in countries with a high rate of meningitis should be immunized. A first-year college student up through age 44 years who is living in a residence hall should receive a dose if she did not receive a dose on or after her 16th birthday. Adults who have certain high-risk conditions should receive one or more doses of vaccine.  Hepatitis A vaccine. Adults who wish to be protected from this disease, have certain high-risk conditions, work with hepatitis A-infected animals, work in hepatitis A research labs, or travel to or work in countries with a high rate of hepatitis A should be immunized. Adults who were previously unvaccinated and who anticipate close contact with an international adoptee during the first 60 days after arrival in the [place] from a country with a high rate of hepatitis A should be immunized.  Hepatitis B vaccine. Adults who wish to be protected from this disease, have certain high-risk conditions, may be exposed to blood or other infectious body fluids, are household contacts or sex partners of hepatitis B positive people, are clients or workers in certain care facilities, or travel to or work in countries with a high rate of hepatitis B should be immunized.  Haemophilus influenzae type b (Hib) vaccine. A previously unvaccinated person with asplenia or sickle cell disease or having a scheduled splenectomy should receive 1 dose of Hib vaccine. Regardless of previous immunization, a recipient of a hematopoietic stem cell transplant should receive a 3-dose series 6-12 months after her successful transplant. Hib vaccine is not recommended for adults with HIV infection. Preventive  Services / Frequency Ages 47 to 69 years  Blood pressure check.** / Every 1 to 2 years.  Lipid and cholesterol check.** / Every 5 years beginning at age 90.  Clinical breast exam.** / Every 3 years for women in their 76s and 80s.  BRCA-related cancer risk assessment.** / For women who have family members with a BRCA-related cancer (breast, ovarian, tubal, or peritoneal cancers).  Pap test.** / Every 2 years from ages 39 through 72. Every 3 years starting at age 40 through age 73 or 76 with a history of 3 consecutive normal Pap tests.  HPV screening.** / Every 3 years from ages 68 through ages 30 to 52 with a history of 3 consecutive normal Pap tests.  Hepatitis C blood test.** / For any individual with known risks for hepatitis C.  Skin self-exam. / Monthly.  Influenza vaccine. / Every year.  Tetanus, diphtheria, and acellular pertussis (Tdap, Td) vaccine.** / Consult your health care provider. Pregnant women should receive 1 dose of Tdap vaccine during each pregnancy. 1 dose of Td every 10 years.  Varicella vaccine.** / Consult your health care provider. Pregnant females who do not have evidence of immunity should receive the first dose after pregnancy.  HPV vaccine. / 3 doses over 6 months, if 65 and younger. The vaccine is not recommended for use in pregnant females. However, pregnancy testing is not needed before receiving a dose.  Measles, mumps, rubella (MMR) vaccine.** / You need at least 1 dose of MMR if you were born in 1957 or later. You may also need a 2nd dose. For females of childbearing age, rubella immunity should be determined. If there is no evidence of immunity, females who are not pregnant should be vaccinated. If there is no evidence of immunity, females who are pregnant should delay immunization until after pregnancy.  Pneumococcal 13-valent conjugate (PCV13) vaccine.** /  Consult your health care provider.  Pneumococcal polysaccharide (PPSV23) vaccine.** / 1 to 2  doses if you smoke cigarettes or if you have certain conditions.  Meningococcal vaccine.** / 1 dose if you are age 42 to 56 years and a Market researcher living in a residence hall, or have one of several medical conditions, you need to get vaccinated against meningococcal disease. You may also need additional booster doses.  Hepatitis A vaccine.** / Consult your health care provider.  Hepatitis B vaccine.** / Consult your health care provider.  Haemophilus influenzae type b (Hib) vaccine.** / Consult your health care provider. Ages 84 to 82 years  Blood pressure check.** / Every 1 to 2 years.  Lipid and cholesterol check.** / Every 5 years beginning at age 39 years.  Lung cancer screening. / Every year if you are aged 30-80 years and have a 30-pack-year history of smoking and currently smoke or have quit within the past 15 years. Yearly screening is stopped once you have quit smoking for at least 15 years or develop a health problem that would prevent you from having lung cancer treatment.  Clinical breast exam.** / Every year after age 35 years.  BRCA-related cancer risk assessment.** / For women who have family members with a BRCA-related cancer (breast, ovarian, tubal, or peritoneal cancers).  Mammogram.** / Every year beginning at age 26 years and continuing for as long as you are in good health. Consult with your health care provider.  Pap test.** / Every 3 years starting at age 43 years through age 34 or 84 years with a history of 3 consecutive normal Pap tests.  HPV screening.** / Every 3 years from ages 61 years through ages 37 to 7 years with a history of 3 consecutive normal Pap tests.  Fecal occult blood test (FOBT) of stool. / Every year beginning at age 35 years and continuing until age 37 years. You may not need to do this test if you get a colonoscopy every 10 years.  Flexible sigmoidoscopy or colonoscopy.** / Every 5 years for a flexible sigmoidoscopy or every  10 years for a colonoscopy beginning at age 14 years and continuing until age 33 years.  Hepatitis C blood test.** / For all people born from [birth year] through 1963 and any individual with known risks for hepatitis C.  Skin self-exam. / Monthly.  Influenza vaccine. / Every year.  Tetanus, diphtheria, and acellular pertussis (Tdap/Td) vaccine.** / Consult your health care provider. Pregnant women should receive 1 dose of Tdap vaccine during each pregnancy. 1 dose of Td every 10 years.  Varicella vaccine.** / Consult your health care provider. Pregnant females who do not have evidence of immunity should receive the first dose after pregnancy.  Zoster vaccine.** / 1 dose for adults aged 76 years or older.  Measles, mumps, rubella (MMR) vaccine.** / You need at least 1 dose of MMR if you were born in 1955 or later. You may also need a 2nd dose. For females of childbearing age, rubella immunity should be determined. If there is no evidence of immunity, females who are not pregnant should be vaccinated. If there is no evidence of immunity, females who are pregnant should delay immunization until after pregnancy.  Pneumococcal 13-valent conjugate (PCV13) vaccine.** / Consult your health care provider.  Pneumococcal polysaccharide (PPSV23) vaccine.** / 1 to 2 doses if you smoke cigarettes or if you have certain conditions.  Meningococcal vaccine.** / Consult your health care provider.  Hepatitis A vaccine.** / Consult  your health care provider.  Hepatitis B vaccine.** / Consult your health care provider.  Haemophilus influenzae type b (Hib) vaccine.** / Consult your health care provider. Ages 71 years and over  Blood pressure check.** / Every 1 to 2 years.  Lipid and cholesterol check.** / Every 5 years beginning at age 31 years.  Lung cancer screening. / Every year if you are aged 26-80 years and have a 30-pack-year history of smoking and currently smoke or have quit within the past 15 years.  Yearly screening is stopped once you have quit smoking for at least 15 years or develop a health problem that would prevent you from having lung cancer treatment.  Clinical breast exam.** / Every year after age 47 years.  BRCA-related cancer risk assessment.** / For women who have family members with a BRCA-related cancer (breast, ovarian, tubal, or peritoneal cancers).  Mammogram.** / Every year beginning at age 57 years and continuing for as long as you are in good health. Consult with your health care provider.  Pap test.** / Every 3 years starting at age 78 years through age 75 or 40 years with 3 consecutive normal Pap tests. Testing can be stopped between 65 and 70 years with 3 consecutive normal Pap tests and no abnormal Pap or HPV tests in the past 10 years.  HPV screening.** / Every 3 years from ages 57 years through ages 50 or 78 years with a history of 3 consecutive normal Pap tests. Testing can be stopped between 65 and 70 years with 3 consecutive normal Pap tests and no abnormal Pap or HPV tests in the past 10 years.  Fecal occult blood test (FOBT) of stool. / Every year beginning at age 40 years and continuing until age 27 years. You may not need to do this test if you get a colonoscopy every 10 years.  Flexible sigmoidoscopy or colonoscopy.** / Every 5 years for a flexible sigmoidoscopy or every 10 years for a colonoscopy beginning at age 73 years and continuing until age 55 years.  Hepatitis C blood test.** / For all people born from [birth year] through 1963 and any individual with known risks for hepatitis C.  Osteoporosis screening.** / A one-time screening for women ages 65 years and over and women at risk for fractures or osteoporosis.  Skin self-exam. / Monthly.  Influenza vaccine. / Every year.  Tetanus, diphtheria, and acellular pertussis (Tdap/Td) vaccine.** / 1 dose of Td every 10 years.  Varicella vaccine.** / Consult your health care provider.  Zoster vaccine.** / 1  dose for adults aged 62 years or older.  Pneumococcal 13-valent conjugate (PCV13) vaccine.** / Consult your health care provider.  Pneumococcal polysaccharide (PPSV23) vaccine.** / 1 dose for all adults aged 90 years and older.  Meningococcal vaccine.** / Consult your health care provider.  Hepatitis A vaccine.** / Consult your health care provider.  Hepatitis B vaccine.** / Consult your health care provider.  Haemophilus influenzae type b (Hib) vaccine.** / Consult your health care provider. ** Family history and personal history of risk and conditions may change your health care provider's recommendations. Document Released: 08/14/2001 Document Revised: 11/02/2013 Document Reviewed: 11/13/2010 [facility] Patient Information 2015 [place], [place]. This information is not intended to replace advice given to you by your health care provider. Make sure you discuss any questions you have with your health care provider.

## 2014-09-09 NOTE — Progress Notes (Signed)
   Subjective:    Patient ID: Tiffany Casey, female    DOB: [birth year], 34 y.o.   MRN: [id number]  HPI   Pt is in for physical.  Works at [employer], pt not exercising, Caffeinated beverage one time day, some pepsi at home, Eats out a lot, single- no kids.  Pt is smoking. 1/4 pack a day. Pt just started vaping and trying to smoke less.  Alcohol- 2 beers about every 1-2 weeks.  LMP- 1 wk ago.  Pt can't remember when her last paps mear was.         Review of Systems  Constitutional: Negative for fever, chills, diaphoresis, activity change and fatigue.  HENT: Negative for congestion, drooling, ear discharge, ear pain, postnasal drip, rhinorrhea and sinus pressure.   Respiratory: Negative for cough, chest tightness and shortness of breath.   Cardiovascular: Negative for chest pain, palpitations and leg swelling.  Gastrointestinal: Negative for nausea, vomiting and abdominal pain.  Musculoskeletal: Negative for back pain, neck pain and neck stiffness.  Neurological: Negative for dizziness, tremors, seizures, syncope, facial asymmetry, speech difficulty, weakness, light-headedness, numbness and headaches.  Psychiatric/Behavioral: Negative for behavioral problems, confusion and agitation. The patient is not nervous/anxious.        Objective:   Physical Exam   General   Mental Status- Alert.  Orientation-Oriented x3. Build and Nutrition Well Nourished and Well Developed.  Skin General: Normal.  Color- Normal color. Moisture- Dry.Temperature warm. Lesions: No suspicious lesions  Head, Eyes, Ears, Nose, Thoat Ears-Normal. Auditory Canal-Bilateral-Normal. Tympanic Membrane- Bilateral-Normal. Eyes Fundi- Bilateral-Normal. Pupil- Bilateral- Direct reaction to light normal. Nose & Sinuses- Normal. Nostril- Bilateral-Normal.  Neck Neck- No Bruits or Masses. Thyroid- Normal. No thyromegaly or nodules.  Breast Breast Lump: No palpable masses, symmetric, no axillary  lymphadenopathy palpated.  Chest and Lung Exam  Percussion: Quality and Intensity:-Percussion normal. Percussion of chest reveals- No Dullness. Palpation of the chest reveals- Non-tender. Auscultation: Breath sounds-Normal. Adventitious  Sounds:No adventitious   Vaginal External: Labia majora and minora normal/no lesions. Pelvic/Bimanual exam: Cervical OS not red or friable. No discharge. No cervical motion tenderness. No masses felt on palpation of adnexal regions. Cardiovascular Inspection: No Heaves. Auscultation: Heart Sounds- Normal sinus rhythm without murmur or gallop, S1 WNL and S2 WNL.  Abdomen Inspection:- Inspection Normal. Inspection of abdomen reveals- No Hernias. Palpation/Percussion: Palpation and Percussion of the abdomen reveal- Non Tender and No Palpable masses. Liver: Other Characteristics- No Hepatmegaly Spleen:Other Characteristics- No Splenomegaly. Auscultation: Auscultation of the abdomen reveals-Bowel sounds normal and No Abdominal bruits.      Neurologic Mental Status- Normal Cranial Nerves- Normal Bilaterally, Motor- Normal. Strength:5/5 normal muscle strength- All Muscles. General Assessment of Reflexes- Right Knee- 2+. Left Knee- 2+. Coordination- Normal. Gait- Normal. Meningeal Signs- None.  Musculoskeletal Global Assessment General- Joints show full range of motion without obvious deformity and Normal muscle mass. Strength 5/5 in upper and lower extremities.  Lymphatic General lymphatics Description-No Generalized lymphadenopathy.         Assessment & Plan:

## 2014-09-09 NOTE — Addendum Note (Signed)
Addended by: Lurline HareARTER, Sonora Catlin E on: 09/09/2014 04:26 PM   Modules accepted: Orders

## 2014-09-09 NOTE — Assessment & Plan Note (Addendum)
Cbc, cmp, tsh, lipid, ua, std ancillary studies including hiv, and pap.  Did recommend better diet, decrease caffeine and some exercise.  Also counseled on smoking cessation.

## 2014-09-09 NOTE — Addendum Note (Signed)
Addended by: Lurline HareARTER, Leighann Amadon E on: 09/09/2014 04:05 PM   Modules accepted: Orders

## 2014-09-10 ENCOUNTER — Telehealth: Payer: Self-pay | Admitting: Medical

## 2014-09-10 LAB — TSH: TSH: 0.44 u[IU]/mL (ref 0.35–4.50)

## 2014-09-10 LAB — URINE CULTURE: Colony Count: 40000

## 2014-09-10 NOTE — Telephone Encounter (Signed)
emmi mailed  °

## 2014-09-11 ENCOUNTER — Telehealth: Payer: Self-pay | Admitting: Medical

## 2014-09-11 LAB — CERVICOVAGINAL ANCILLARY ONLY
Bacterial vaginitis: POSITIVE — AB
Bacterial vaginitis: POSITIVE — AB
Bacterial vaginitis: POSITIVE — AB
Bacterial vaginitis: POSITIVE — AB
Candida vaginitis: NEGATIVE

## 2014-09-11 MED ORDER — METRONIDAZOLE 500 MG PO TABS: 500.0000 mg | ORAL_TABLET | Freq: Three times a day (TID) | ORAL | Status: DC

## 2014-09-11 NOTE — Telephone Encounter (Signed)
BV on study. Rx flagyl.

## 2014-09-13 LAB — CYTOLOGY - PAP

## 2015-01-12 ENCOUNTER — Emergency Department (HOSPITAL_BASED_OUTPATIENT_CLINIC_OR_DEPARTMENT_OTHER)
Admission: EM | Admit: 2015-01-12 | Discharge: 2015-01-12 | Disposition: A | Discharge status: Home / Self Care | Payer: 59 | Attending: Emergency Medicine | Admitting: Emergency Medicine

## 2015-01-12 ENCOUNTER — Encounter (HOSPITAL_BASED_OUTPATIENT_CLINIC_OR_DEPARTMENT_OTHER): Payer: Self-pay

## 2015-01-12 ENCOUNTER — Emergency Department (HOSPITAL_BASED_OUTPATIENT_CLINIC_OR_DEPARTMENT_OTHER): Payer: 59

## 2015-01-12 DIAGNOSIS — I252 Old myocardial infarction: Secondary | ICD-10-CM | POA: Insufficient documentation

## 2015-01-12 DIAGNOSIS — K029 Dental caries, unspecified: Secondary | ICD-10-CM | POA: Insufficient documentation

## 2015-01-12 DIAGNOSIS — Z8639 Personal history of other endocrine, nutritional and metabolic disease: Secondary | ICD-10-CM | POA: Insufficient documentation

## 2015-01-12 DIAGNOSIS — Z8659 Personal history of other mental and behavioral disorders: Secondary | ICD-10-CM | POA: Insufficient documentation

## 2015-01-12 DIAGNOSIS — Z720 Tobacco use: Secondary | ICD-10-CM | POA: Insufficient documentation

## 2015-01-12 MED ORDER — AMOXICILLIN-POT CLAVULANATE 875-125 MG PO TABS
1.0000 | ORAL_TABLET | Freq: Once | ORAL | Status: AC
  Administered 2015-01-12: 1 via ORAL
  Filled 2015-01-12: qty 1

## 2015-01-12 MED ORDER — HYDROCODONE-ACETAMINOPHEN 5-325 MG PO TABS: 2.0000 | ORAL_TABLET | ORAL | Status: DC | PRN

## 2015-01-12 MED ORDER — IBUPROFEN 800 MG PO TABS
800.0000 mg | ORAL_TABLET | Freq: Once | ORAL | Status: AC
  Administered 2015-01-12: 800 mg via ORAL
  Filled 2015-01-12: qty 1

## 2015-01-12 MED ORDER — IBUPROFEN 800 MG PO TABS: 800.0000 mg | ORAL_TABLET | Freq: Three times a day (TID) | ORAL | Status: DC

## 2015-01-12 MED ORDER — AMOXICILLIN-POT CLAVULANATE 875-125 MG PO TABS: 1.0000 | ORAL_TABLET | Freq: Two times a day (BID) | ORAL | Status: DC

## 2015-01-12 NOTE — ED Provider Notes (Signed)
CSN: [id number]     Arrival date & time 01/12/15  1655 History   First MD Initiated Contact with Patient 01/12/15 1701     Chief Complaint  Patient presents with  . Dental Pain     (Consider location/radiation/quality/duration/timing/severity/associated sxs/prior Treatment) The history is provided by the patient.  Tiffany Casey is a 34 y.o. female hx of depression, GERD, here presenting with toothache. She noticed that the left lower molar has been more painful for the last 2 days. Also some more swelling on the left neck area. She has some trouble swallowing but able to keep water and food down. Denies any fevers or chills. Does not have a dentist.   Past Medical History  Diagnosis Date  . Depression   . GERD (gastroesophageal reflux disease)   . Hyperlipidemia   . Myocardial infarction    Past Surgical History  Procedure Laterality Date  . Cholecystectomy    . Tonsillectomy and adenoidectomy     Family History  Problem Relation Age of Onset  . Diabetes Mother   . Thyroid disease Mother    History  Substance Use Topics  . Smoking status: Current Every Day Smoker -- 0.25 packs/day for 14 years  . Smokeless tobacco: Never Used  . Alcohol Use: Yes     Comment: ocassionaly   OB History    No data available     Review of Systems  HENT:       Toothache   All other systems reviewed and are negative.     Allergies  Review of patient's allergies indicates no known allergies.  Home Medications   Prior to Admission medications   Not on File   BP 145/82 mmHg  Pulse 71  Temp(Src) 98.4 F (36.9 C) (Oral)  Resp 16  Ht 5\' 7"  (1.702 m)  Wt 270 lb (122.471 kg)  BMI 42.28 kg/m2  SpO2 100%  LMP 12/29/2014 Physical Exam  Constitutional: She is oriented to person, place, and time. She appears well-developed and well-nourished.  HENT:  Head: Normocephalic.  Mouth/Throat:    L lower molar with obvious cavity. No obvious periapical abscess. Floor of mouth is soft  and not firm. Mild L Cervical LAD with no stridor.   Eyes: Conjunctivae are normal. Pupils are equal, round, and reactive to light.  Cardiovascular: Normal rate, regular rhythm and normal heart sounds.   Pulmonary/Chest: Effort normal and breath sounds normal. No respiratory distress. She has no wheezes. She has no rales.  Abdominal: Soft. Bowel sounds are normal. She exhibits no distension. There is no tenderness. There is no rebound.  Musculoskeletal: Normal range of motion. She exhibits no edema or tenderness.  Neurological: She is alert and oriented to person, place, and time. No cranial nerve deficit. Coordination normal.  Skin: Skin is warm and dry.  Psychiatric: She has a normal mood and affect. Her behavior is normal. Judgment and thought content normal.  Nursing note and vitals reviewed.   ED Course  Procedures (including critical care time) Labs Review Labs Reviewed - No data to display  Imaging Review Dg Neck Soft Tissue  01/12/2015   CLINICAL DATA:  34 year old female with difficulty swallowing and pain in the left cheek  EXAM: NECK SOFT TISSUES - 1+ VIEW  COMPARISON:  None.  FINDINGS: There is no evidence of retropharyngeal soft tissue swelling or epiglottic enlargement. The cervical airway is unremarkable and no radio-opaque foreign body identified.  IMPRESSION: Negative.   Electronically Signed   By: Isac CaddyHeath  McCullough M.D.  On: 01/12/2015 18:00     EKG Interpretation None      MDM   Final diagnoses:  None   Tiffany Casey is a 34 y.o. female here with L lower molar pain. No obvious periapical abscess. Does have neck swelling but no floor of mouth tenderness. Will get xray to make sure she has no RPA. No signs of ludwig currently.   6:19 PM Xray unremarkable. Felt better after meds. Airway intact. Will dc home.    Richardean Canal, MD 01/12/15 Zollie Pee

## 2015-01-12 NOTE — ED Notes (Signed)
Left lower tooth ache x 2 days-also c/o "throat swelling"-pt NAD

## 2015-01-12 NOTE — Discharge Instructions (Signed)
Take motrin for pain.  Take augmentin twice daily for a week.  Call dentist for follow up (see packets attached).  Return to ER if you have worse neck swelling, trouble swallowing, trouble breathing.

## 2016-01-10 ENCOUNTER — Encounter (HOSPITAL_BASED_OUTPATIENT_CLINIC_OR_DEPARTMENT_OTHER): Payer: Self-pay

## 2016-01-10 ENCOUNTER — Emergency Department (HOSPITAL_BASED_OUTPATIENT_CLINIC_OR_DEPARTMENT_OTHER)
Admission: EM | Admit: 2016-01-10 | Discharge: 2016-01-10 | Disposition: A | Discharge status: Home / Self Care | Payer: 59 | Attending: Emergency Medicine | Admitting: Emergency Medicine

## 2016-01-10 DIAGNOSIS — Z791 Long term (current) use of non-steroidal anti-inflammatories (NSAID): Secondary | ICD-10-CM | POA: Insufficient documentation

## 2016-01-10 DIAGNOSIS — B9689 Other specified bacterial agents as the cause of diseases classified elsewhere: Secondary | ICD-10-CM

## 2016-01-10 DIAGNOSIS — E785 Hyperlipidemia, unspecified: Secondary | ICD-10-CM | POA: Insufficient documentation

## 2016-01-10 DIAGNOSIS — N730 Acute parametritis and pelvic cellulitis: Secondary | ICD-10-CM

## 2016-01-10 DIAGNOSIS — A5901 Trichomonal vulvovaginitis: Secondary | ICD-10-CM | POA: Insufficient documentation

## 2016-01-10 DIAGNOSIS — F329 Major depressive disorder, single episode, unspecified: Secondary | ICD-10-CM | POA: Insufficient documentation

## 2016-01-10 DIAGNOSIS — F17200 Nicotine dependence, unspecified, uncomplicated: Secondary | ICD-10-CM | POA: Insufficient documentation

## 2016-01-10 DIAGNOSIS — I252 Old myocardial infarction: Secondary | ICD-10-CM | POA: Insufficient documentation

## 2016-01-10 DIAGNOSIS — Z79899 Other long term (current) drug therapy: Secondary | ICD-10-CM | POA: Insufficient documentation

## 2016-01-10 DIAGNOSIS — N739 Female pelvic inflammatory disease, unspecified: Secondary | ICD-10-CM | POA: Insufficient documentation

## 2016-01-10 DIAGNOSIS — N760 Acute vaginitis: Secondary | ICD-10-CM | POA: Insufficient documentation

## 2016-01-10 LAB — URINE MICROSCOPIC-ADD ON

## 2016-01-10 LAB — URINALYSIS, ROUTINE W REFLEX MICROSCOPIC
GLUCOSE, UA: NEGATIVE mg/dL
KETONES UR: NEGATIVE mg/dL
Nitrite: NEGATIVE
Protein, ur: 30 mg/dL — AB
Specific Gravity, Urine: 1.028 (ref 1.005–1.030)
pH: 5.5 (ref 5.0–8.0)

## 2016-01-10 LAB — PREGNANCY, URINE: PREG TEST UR: NEGATIVE

## 2016-01-10 LAB — WET PREP, GENITAL
Sperm: NONE SEEN
YEAST WET PREP: NONE SEEN

## 2016-01-10 MED ORDER — CEFTRIAXONE SODIUM 250 MG IJ SOLR
250.0000 mg | Freq: Once | INTRAMUSCULAR | Status: AC
  Administered 2016-01-10: 250 mg via INTRAMUSCULAR
  Filled 2016-01-10: qty 250

## 2016-01-10 MED ORDER — DOXYCYCLINE HYCLATE 100 MG PO CAPS: 100.0000 mg | ORAL_CAPSULE | Freq: Two times a day (BID) | ORAL | Status: AC

## 2016-01-10 MED ORDER — AZITHROMYCIN 250 MG PO TABS
1000.0000 mg | ORAL_TABLET | Freq: Once | ORAL | Status: AC
  Administered 2016-01-10: 1000 mg via ORAL
  Filled 2016-01-10: qty 4

## 2016-01-10 MED ORDER — METRONIDAZOLE 500 MG PO TABS: 500.0000 mg | ORAL_TABLET | Freq: Two times a day (BID) | ORAL | Status: AC

## 2016-01-10 NOTE — ED Notes (Addendum)
C/o vaginal bleeding(blood when she wiped x 1-no pad needed), abd pain x today-LMP ended 1-2 weeks ago-NAD-steady gait

## 2016-01-10 NOTE — Discharge Instructions (Signed)

## 2016-01-10 NOTE — ED Provider Notes (Signed)
CSN: [id number]     Arrival date & time 01/10/16  2111 History   By signing my name below, I, Tiffany Casey, attest that this documentation has been prepared under the direction and in the presence of Tiffany Planan Larron Armor, DO.  Electronically Signed: Rosario AdieWilliam Andrew Casey, ED Scribe. 01/10/2016. 9:46 PM.  Chief Complaint  Patient presents with  . Vaginal Bleeding   Patient is a 35 y.o. female presenting with vaginal bleeding. The history is provided by the patient. No language interpreter was used.  Vaginal Bleeding Quality:  Bright red Severity:  Moderate Onset quality:  Sudden Timing:  Intermittent Progression:  Worsening Chronicity:  New Menstrual history:  Regular Number of pads used:  0 Number of tampons used:  0 Possible pregnancy: no   Context: spontaneously   Relieved by:  None tried Worsened by:  Nothing tried Ineffective treatments:  None tried Associated symptoms: abdominal pain (lower, diffuse)   Associated symptoms: no dizziness, no dysuria, no fever and no nausea   Abdominal pain:    Location:  LLQ and RLQ   Quality:  Unable to specify   Severity:  Moderate   Onset quality:  Gradual   Timing:  Constant   Progression:  Worsening   Chronicity:  New  HPI Comments: Tiffany Casey is a 35 y.o. female with a PMHx of GERD, HLD, and MI who presents to the Emergency Department complaining of sudden onset, gradually worsening, intermittent vaginal bleeding onset just PTA. She reports that she was wiping when she noticed the bleeding. Pt described that it feels like she is beginning her menstrual cycle, but her LNMP was 2 weeks ago. She also has associated diffuse lower abdominal pain. She has a hx of similar abdominal pain, and states that it was from her gallbladder, and had to have a cholecystectomy at the time. She takes 800mg  Ibuprofen for her hx of headaches as needed. She denies fever. Pt is not complaining of any other symptoms.  Past Medical History  Diagnosis Date  .  Depression   . GERD (gastroesophageal reflux disease)   . Hyperlipidemia   . Myocardial infarction [facility](HCC)    Past Surgical History  Procedure Laterality Date  . Cholecystectomy    . Tonsillectomy and adenoidectomy     Family History  Problem Relation Age of Onset  . Diabetes Mother   . Thyroid disease Mother    Social History  Substance Use Topics  . Smoking status: Current Every Day Smoker -- 0.25 packs/day for 14 years  . Smokeless tobacco: Never Used  . Alcohol Use: Yes     Comment: ocassionaly   OB History    No data available     Review of Systems  Constitutional: Negative for fever and chills.  HENT: Negative for congestion and rhinorrhea.   Eyes: Negative for redness and visual disturbance.  Respiratory: Negative for shortness of breath and wheezing.   Cardiovascular: Negative for chest pain and palpitations.  Gastrointestinal: Positive for abdominal pain (lower, diffuse). Negative for nausea and vomiting.  Genitourinary: Positive for vaginal bleeding. Negative for dysuria and urgency.  Musculoskeletal: Negative for myalgias and arthralgias.  Skin: Negative for pallor and wound.  Neurological: Negative for dizziness and headaches.   Allergies  Review of patient's allergies indicates no known allergies.  Home Medications   Prior to Admission medications   Medication Sig Start Date End Date Taking? Authorizing Provider  Cyclobenzaprine HCl (FLEXERIL PO) Take by mouth.   Yes Historical Provider, MD  GABAPENTIN PO Take  by mouth.   Yes Historical Provider, MD  ibuprofen (ADVIL,MOTRIN) 800 MG tablet Take 800 mg by mouth every 8 (eight) hours as needed.   Yes Historical Provider, MD  TRAZODONE HCL PO Take by mouth.   Yes Historical Provider, MD  doxycycline (VIBRAMYCIN) 100 MG capsule Take 1 capsule (100 mg total) by mouth 2 (two) times daily. One po bid x 7 days 01/10/16   Tiffany Plan, DO  metroNIDAZOLE (FLAGYL) 500 MG tablet Take 1 tablet (500 mg total) by mouth 2  (two) times daily. One po bid x 7 days 01/10/16   Tiffany Plan, DO   BP 126/84 mmHg  Pulse 88  Temp(Src) 98.8 F (37.1 C) (Oral)  Resp 18  Ht  (1.702 m)  Wt 227 lb (102.967 kg)  BMI 35.55 kg/m2  SpO2 100%  LMP 12/23/2015   Physical Exam  Constitutional: She is oriented to person, place, and time. She appears well-developed and well-nourished. No distress.  HENT:  Head: Normocephalic and atraumatic.  Eyes: EOM are normal. Pupils are equal, round, and reactive to light.  Neck: Normal range of motion.  Cardiovascular: Normal rate, regular rhythm and normal heart sounds.  Exam reveals no gallop and no friction rub.   No murmur heard. Pulmonary/Chest: Effort normal and breath sounds normal. She has no wheezes. She has no rales.  Abdominal: Soft. She exhibits no distension. There is tenderness. There is no rebound and no guarding.  Mild, diffuse lower abdominal tenderness. No RUQ tenderness.  Genitourinary: Vaginal discharge found.  Foul smelling, Vokes, thick discharge from the cervix. Pt has cervical motion tenderness. No unilateral adnexal tenderness.  Musculoskeletal: Normal range of motion.  Neurological: She is alert and oriented to person, place, and time.  Skin: Skin is warm and dry. She is not diaphoretic.  Psychiatric: She has a normal mood and affect. Her behavior is normal.  Nursing note and vitals reviewed.  ED Course  Procedures (including critical care time)  DIAGNOSTIC STUDIES: Oxygen Saturation is 100% on RA, normal by my interpretation.   COORDINATION OF CARE: 9:46 PM-Discussed next steps with pt including pelvic exam. Pt verbalized understanding and is agreeable with the plan.   Labs Review Labs Reviewed  WET PREP, GENITAL - Abnormal; Notable for the following:    Trich, Wet Prep PRESENT (*)    Clue Cells Wet Prep HPF POC PRESENT (*)    WBC, Wet Prep HPF POC MANY (*)    All other components within normal limits  URINALYSIS, ROUTINE W REFLEX MICROSCOPIC  (NOT AT [facility]) - Abnormal; Notable for the following:    Color, Urine AMBER (*)    APPearance CLOUDY (*)    Hgb urine dipstick LARGE (*)    Bilirubin Urine SMALL (*)    Protein, ur 30 (*)    Leukocytes, UA MODERATE (*)    All other components within normal limits  URINE MICROSCOPIC-ADD ON - Abnormal; Notable for the following:    Squamous Epithelial / LPF 6-30 (*)    Bacteria, UA MANY (*)    All other components within normal limits  PREGNANCY, URINE  GC/CHLAMYDIA PROBE AMP ([place]) NOT AT [facility]    Imaging Review No results found.  I have personally reviewed and evaluated these images and lab results as part of my medical decision-making.   EKG Interpretation None      MDM   Final diagnoses:  BV (bacterial vaginosis)  Trichomonas vaginalis (TV) infection  PID (acute pelvic inflammatory disease)  35 yo F with cc of vaginal bleeding, though no noted bleeding on exam.  Patient with diffuse discharge and CMT, feel likely PID.  Treated.  I personally performed the services described in this documentation, which was scribed in my presence. The recorded information has been reviewed and is accurate.   I have discussed the diagnosis/risks/treatment options with the patient and believe the pt to be eligible for discharge home to follow-up with PCP/GYN. We also discussed returning to the ED immediately if new or worsening sx occur. We discussed the sx which are most concerning (e.g., sudden worsening pain, fever, inability to tolerate by mouth ) that necessitate immediate return. Medications administered to the patient during their visit and any new prescriptions provided to the patient are listed below.  Medications given during this visit Medications  cefTRIAXone (ROCEPHIN) injection 250 mg (250 mg Intramuscular Given 01/10/16 2212)  azithromycin (ZITHROMAX) tablet 1,000 mg (1,000 mg Oral Given 01/10/16 2212)    Discharge Medication List as of 01/10/2016 10:22 PM    START  taking these medications   Details  doxycycline (VIBRAMYCIN) 100 MG capsule Take 1 capsule (100 mg total) by mouth 2 (two) times daily. One po bid x 7 days, Starting 01/10/2016, Until Discontinued, Print    metroNIDAZOLE (FLAGYL) 500 MG tablet Take 1 tablet (500 mg total) by mouth 2 (two) times daily. One po bid x 7 days, Starting 01/10/2016, Until Discontinued, Print        The patient appears reasonably screen and/or stabilized for discharge and I doubt any other medical condition or other [facility] requiring further screening, evaluation, or treatment in the ED at this time prior to discharge.       Tiffany Plan, DO 01/11/16 2015

## 2016-01-11 LAB — GC/CHLAMYDIA PROBE AMP (~~LOC~~) NOT AT ARMC
CHLAMYDIA, DNA PROBE: NEGATIVE
NEISSERIA GONORRHEA: NEGATIVE

## 2017-01-23 IMAGING — DX DG NECK SOFT TISSUE
2 series · 2 of 2 positions shown · non-contrast
Comparison: None.

CLINICAL DATA: 34-year-old female with difficulty swallowing and
pain in the left cheek

EXAM:
NECK SOFT TISSUES - 1+ VIEW

[neck lat]
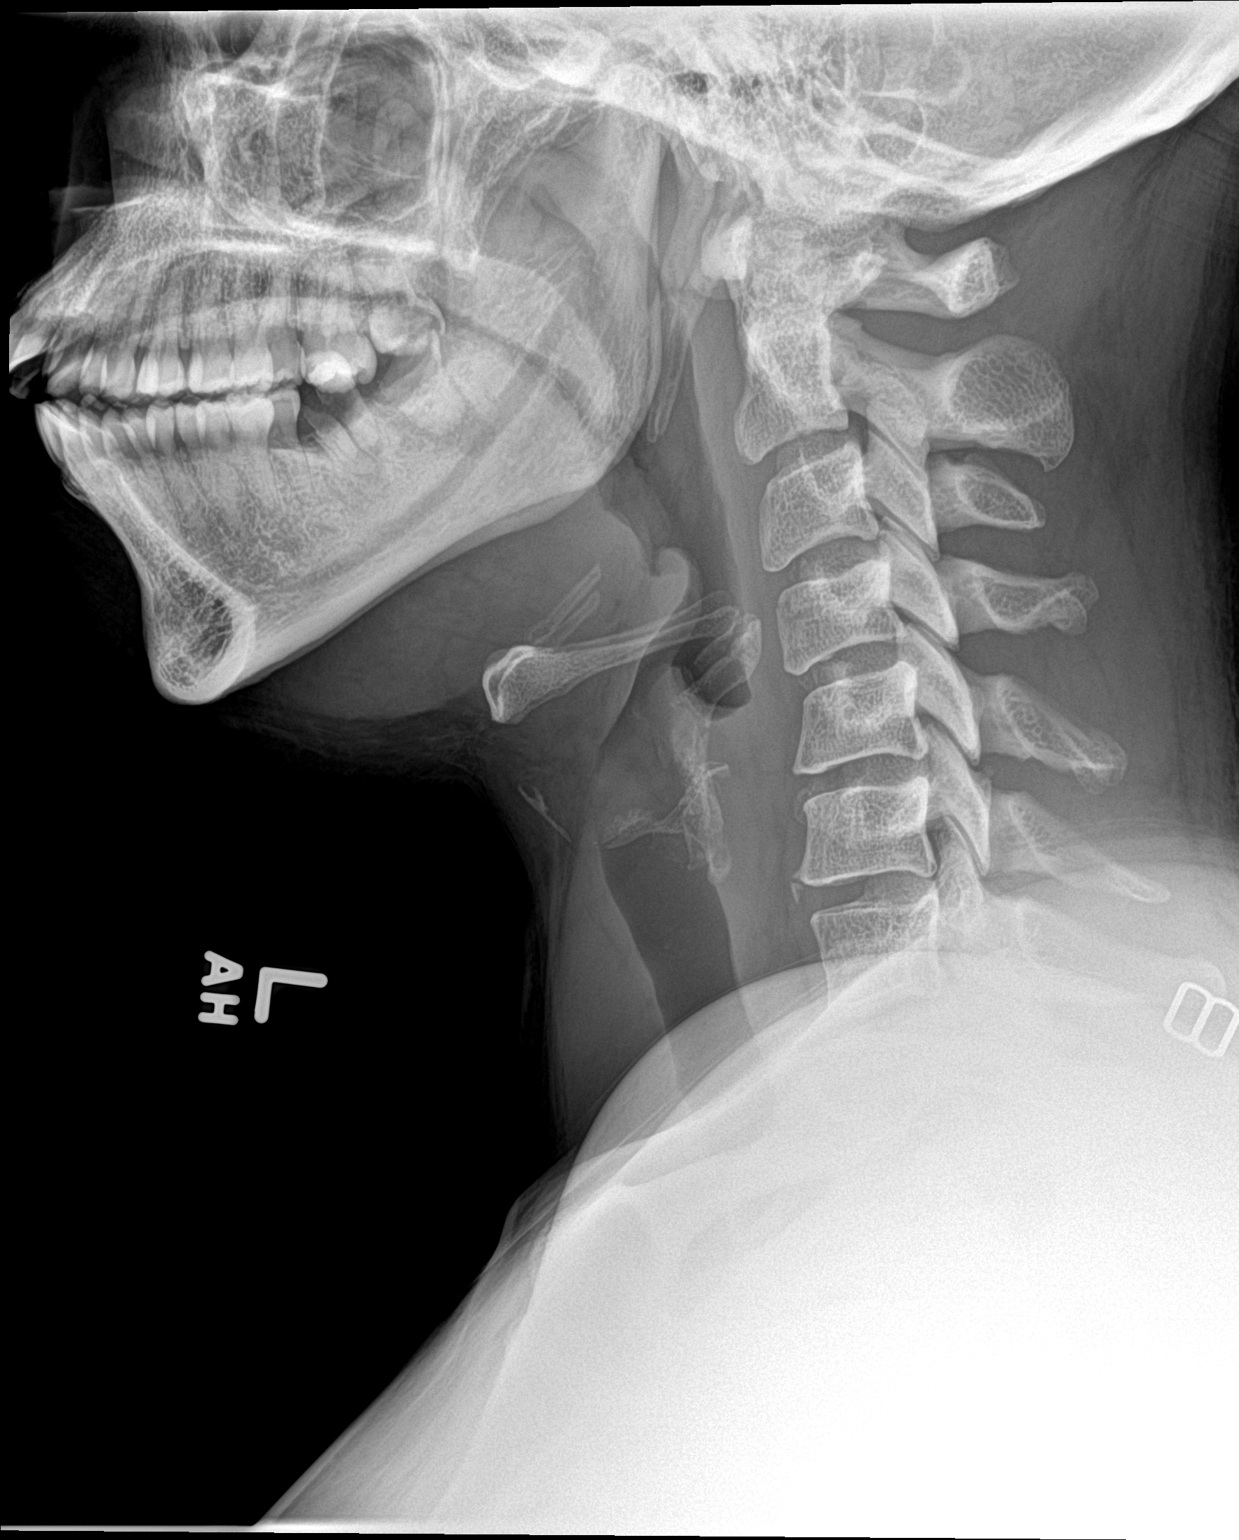

[neck ap]
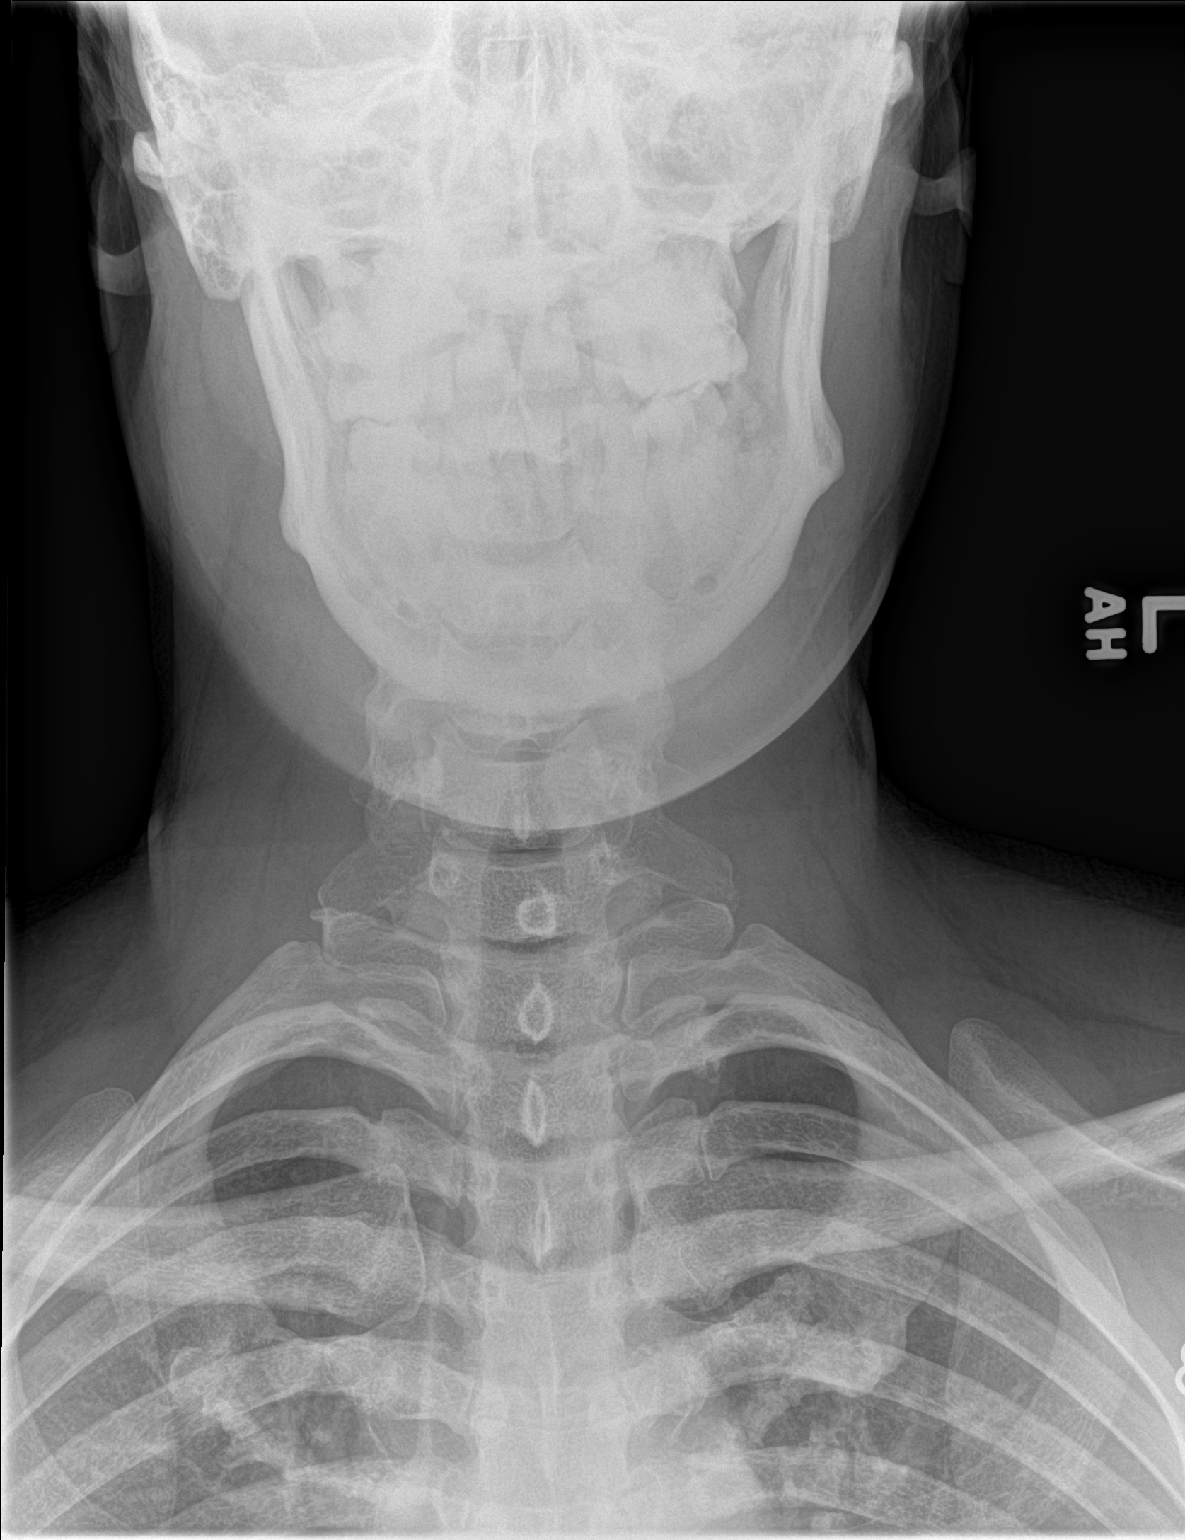

[2 of 2 positions shown; findings below may reference images not displayed]

FINDINGS: There is no evidence of retropharyngeal soft tissue swelling or
epiglottic enlargement. The cervical airway is unremarkable and no
radio-opaque foreign body identified.
IMPRESSION: Negative.
